# Patient Record
Sex: Male | Born: 1945 | Race: White | Hispanic: No | Marital: Married | Smoking: Never smoker
Health system: Southern US, Community
[De-identification: ages and names within clinical notes are randomized; demographics above are authoritative.]

## PROBLEM LIST (undated history)

## (undated) DIAGNOSIS — F419 Anxiety disorder, unspecified: Secondary | ICD-10-CM

## (undated) DIAGNOSIS — M1990 Unspecified osteoarthritis, unspecified site: Secondary | ICD-10-CM

## (undated) DIAGNOSIS — I10 Essential (primary) hypertension: Secondary | ICD-10-CM

## (undated) DIAGNOSIS — Z87442 Personal history of urinary calculi: Secondary | ICD-10-CM

## (undated) HISTORY — PX: HERNIA REPAIR: SHX51

## (undated) HISTORY — PX: URETERAL STENT PLACEMENT: SHX822

## (undated) HISTORY — PX: BACK SURGERY: SHX140

---

## 2000-02-25 ENCOUNTER — Emergency Department (HOSPITAL_COMMUNITY): Admission: EM | Admit: 2000-02-25 | Discharge: 2000-02-25 | Payer: Self-pay

## 2001-02-23 ENCOUNTER — Ambulatory Visit (HOSPITAL_COMMUNITY): Admission: RE | Admit: 2001-02-23 | Discharge: 2001-02-23 | Payer: Self-pay | Admitting: Gastroenterology

## 2001-02-23 ENCOUNTER — Encounter (INDEPENDENT_AMBULATORY_CARE_PROVIDER_SITE_OTHER): Payer: Self-pay | Admitting: Specialist

## 2005-07-29 ENCOUNTER — Inpatient Hospital Stay (HOSPITAL_COMMUNITY): Admission: EM | Admit: 2005-07-29 | Discharge: 2005-08-01 | Payer: Self-pay | Admitting: *Deleted

## 2008-02-16 ENCOUNTER — Emergency Department (HOSPITAL_COMMUNITY): Admission: EM | Admit: 2008-02-16 | Discharge: 2008-02-16 | Payer: Self-pay | Admitting: Family Medicine

## 2010-10-17 NOTE — Discharge Summary (Signed)
Javier Ewing, Javier Ewing NO.:  [id number]   MEDICAL RECORD NO.:  [id number]          PATIENT TYPE:  INP   LOCATION:  2007                         FACILITY:  [facility]   PHYSICIAN:  Barry Dienes. Eloise Harman, M.D.DATE OF BIRTH:  [birth year]   DATE OF ADMISSION:  07/29/2005  DATE OF DISCHARGE:  08/01/2005                                 DISCHARGE SUMMARY   PERTINENT FINDINGS:  The patient is a 65 year old white male with a history  of hypertension, osteoarthritis, and hyperlipidemia who presented to the  emergency room with nausea, vomiting, diarrhea, and hypotension. On the day  prior to admission, he awoke at 2 a.m. with profound nausea, vomiting and  profuse diarrhea. He denied preceding fever, chest pain, or shortness of  breath. He had some minimal abdominal cramping and denied bloody stools. He  also denied recent antibiotic treatment. He was treated with antiemetics and  IV fluids in the emergency room but continued to vomit and remained  hypotensive, so he was admitted for further evaluation.   MEDICATIONS PRIOR TO ADMISSION:  1.  Avalide 300/25 1 tablet p.o. daily.  2.  Aspirin 81 milligrams daily.  3.  Multivitamin 1 tablet daily.   PHYSICAL EXAM:  Temperature 102, blood pressure 90/70, pulse 80, respiratory  rate 18. In general, he was ill-appearing white male who was in no apparent  distress. HEENT exam was within normal limits. Neck was supple without  jugular venous distension or carotid bruit or stiff neck. Chest was clear.  Heart had a regular rate and rhythm without significant murmur or gallop.  Abdomen had normal bowel sounds with no hepatosplenomegaly or tenderness.  Back was without CVA tenderness. Extremities were without cyanosis,  clubbing, or edema.   INITIAL LABORATORY STUDIES:  Serum sodium 137, potassium 4.3, chloride 103,  carbon dioxide 26, BUN 30, creatinine 1.9, glucose 163, calcium 10.3, total  protein 8.6, albumin 4.7. White blood cell 16.2,  hemoglobin 17.7, hematocrit  49, platelets 252,000, 93% neutrophils.   HOSPITAL COURSE:  The patient was admitted to a medical bed without  telemetry. He had extensive IV fluids. He was treated with antiemetics. He  had a CT scan of the abdomen and pelvis with oral contrast alone that showed  the following: 1) Nonobstructing left renal calculus; 2) Diffuse fatty  replacement of the pancreas; 3) Accessory splenule; 4) Normal appendix; 5)  Diffuse small and large bowel air-fluid levels which can be seen with  gastroenteritis; 6) Lumbosacral spine osteoarthritis changes with L5 pars  defects.   COMPLICATIONS:  None.   CONDITION ON DISCHARGE:  He has been eating well over the past day with no  nausea, vomiting, or diarrhea. His current vital signs include blood  pressure 121/80, pulse 67, respirations 20, temperature 97.3, pulse oxygen  saturation 95% on room air. His chest was clear to auscultation. Heart has a  regular rate and rhythm. His abdomen has normal bowel sounds and no  tenderness. His extremities are without edema.   MOST RECENT LABORATORY TESTS:  White blood cell count 8.4, hemoglobin 12,  hematocrit 33, platelets 171,000. Serum  sodium 140, potassium 3.4, chloride  112, carbon dioxide 26, BUN 13, creatinine 0.8, glucose 90. Fecal leukocytes  negative. Stool C. Difficile toxin test negative and blood cultures were  negative to date.   DISCHARGE DIAGNOSES:  1.  Severe viral gastroenteritis.  2.  Dehydration.  3.  Anemia.  4.  Hypokalemia.  5.  History of hypertension.  6.  Osteoarthritis of the lumbar spine.  7.  Spondylolisthesis.   DISCHARGE MEDICATIONS:  1.  He was advised to restart his Avalide 300/25 in two days following      discharge.  2.  Aspirin 81 milligrams daily.  3.  Multivitamin 1 tablet daily.  4.  He was advised that he could use of Imodium AD 1-2 tablets p.o. daily      p.r.n. diarrhea if he has no fever (temperature less than 99.5 degrees       Fahrenheit).   FOLLOW-UP PLANS:  He should see Dr. Jacky Kindle at [facility]  in approximately two weeks following discharge and to call [phone] for that  appointment. He was also advised to call our office or the on-call physician  if he develops severe diarrhea or diarrhea occurs with fever.           ______________________________  Barry Dienes Eloise Harman, M.D.     DGP/MEDQ  D:  08/01/2005  T:  08/03/2005  Job:  [id number]

## 2010-10-17 NOTE — H&P (Signed)
Javier, Ewing NO.:  [id number]   MEDICAL RECORD NO.:  [id number]          PATIENT TYPE:  INP   LOCATION:  1823                         FACILITY:  [facility]   PHYSICIAN:  Geoffry Paradise, M.D.  DATE OF BIRTH:  Sep 14, 1945   DATE OF ADMISSION:  07/29/2005  DATE OF DISCHARGE:                                HISTORY & PHYSICAL   CHIEF COMPLAINT:  Nausea, vomiting, diarrhea.   HISTORY OF PRESENT ILLNESS:  Javier Ewing is a 65 year old gentleman well-known to  myself with hypertension, osteoarthritis, and hyperlipidemia, presenting at  this time with nausea, vomiting, diarrhea, and hypotension. He has no prior  cardiac history and has been in excellent health. His last annual physical  exam was in November and all was stable. He had a normal day yesterday but  awoke at 2 a.m. with profound nausea, vomiting, and profuse diarrhea. He  became weak and wobbly and then subsequently after summoning his wife, who  was out of town, presented to the emergency room for further management. He  denies any preceding fever, chest pain, or shortness of breath. He does  relate some minimal abdominal cramping but no frank pain and no back pain.  He has had no rash, headaches, and no bloody stool or emesis. In the  emergency room his emesis was bilious and his stool was voluminous, and  while he did have bright red blood on one occasion, subsequently it has been  dark green stool. He has been treated aggressively with antiemetics and IV  fluids but remains with a systolic blood pressure in the 90-100 range,  febrile, and continues to vomit. He is admitted for further management.   PAST MEDICAL HISTORY:  No known drug allergies.   Medications include:  1.  Avalide 300/25 one daily.  2.  Aspirin 81 mg daily.  3.  Multivitamin one daily.   Medical illnesses include essential hypertension, osteoarthritis, and  hyperlipidemia. Surgical illnesses:  None. Colonoscopy September 2002.   FAMILY  HISTORY:  Mother with arthritis. Father with colon cancer. Sister:  No illnesses.   SOCIAL HISTORY:  The patient is married, no children of his own. Rare  caffeine, does not smoke or drink. 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   EXAMINATION:  VITAL SIGNS:  Temperature currently is 98.6, T-max was 102.  Systolic blood pressure low 90s, at one point it was logged at 70. Heart  rate is 80, regular. Respiratory rate is 18, unlabored.  GENERAL:  The patient is ill-appearing, supine on the stretcher. He is a bit  pale. Skin is warm to touch, turgor is poor.  HEENT:  Anicteric, visual fields intact, oropharynx benign, dry mucosa. No  blood.  NECK:  Supple, no JVD or bruits. No meningismus.  CHEST:  Clear bilaterally.  CARDIOVASCULAR:  Regular rate and rhythm. No murmur, gallop, rub, or heave.  ABDOMEN:  Soft, good bowel sounds, nontender, no hepatosplenomegaly.  BACK:  Without CVA or spinal tenderness.  EXTREMITIES:  No cyanosis, clubbing, or edema. No rashes. Joints normal.  Warm distally, no mottling.   ASSESSMENT:  1.  Probable  viral gastroenteritis with profound volume depletion.  2.  Essential hypertension.   PLAN:  The patient will be aggressively hydrated, pan cultured. Empiric CT  of abdomen to rule out pancreatitis and/or diverticulitis, albeit doubted.  Further pending his course.           ______________________________  Geoffry Paradise, M.D.     RA/MEDQ  D:  07/29/2005  T:  07/29/2005  Job:  [id number]

## 2011-08-27 ENCOUNTER — Emergency Department (HOSPITAL_COMMUNITY): Payer: Medicare Other

## 2011-08-27 ENCOUNTER — Encounter (HOSPITAL_COMMUNITY): Payer: Self-pay | Admitting: Emergency Medicine

## 2011-08-27 ENCOUNTER — Other Ambulatory Visit: Payer: Self-pay

## 2011-08-27 ENCOUNTER — Emergency Department (HOSPITAL_COMMUNITY)
Admission: EM | Admit: 2011-08-27 | Discharge: 2011-08-27 | Disposition: A | Discharge status: Home / Self Care | Payer: Medicare Other | Attending: Emergency Medicine | Admitting: Emergency Medicine

## 2011-08-27 DIAGNOSIS — R55 Syncope and collapse: Secondary | ICD-10-CM

## 2011-08-27 DIAGNOSIS — S0101XA Laceration without foreign body of scalp, initial encounter: Secondary | ICD-10-CM

## 2011-08-27 DIAGNOSIS — I10 Essential (primary) hypertension: Secondary | ICD-10-CM | POA: Insufficient documentation

## 2011-08-27 DIAGNOSIS — R404 Transient alteration of awareness: Secondary | ICD-10-CM | POA: Insufficient documentation

## 2011-08-27 DIAGNOSIS — E860 Dehydration: Secondary | ICD-10-CM | POA: Insufficient documentation

## 2011-08-27 DIAGNOSIS — M47812 Spondylosis without myelopathy or radiculopathy, cervical region: Secondary | ICD-10-CM | POA: Insufficient documentation

## 2011-08-27 DIAGNOSIS — S0100XA Unspecified open wound of scalp, initial encounter: Secondary | ICD-10-CM | POA: Insufficient documentation

## 2011-08-27 DIAGNOSIS — W1809XA Striking against other object with subsequent fall, initial encounter: Secondary | ICD-10-CM | POA: Insufficient documentation

## 2011-08-27 DIAGNOSIS — R112 Nausea with vomiting, unspecified: Secondary | ICD-10-CM | POA: Insufficient documentation

## 2011-08-27 HISTORY — DX: Essential (primary) hypertension: I10

## 2011-08-27 LAB — BASIC METABOLIC PANEL
BUN: 31 mg/dL — ABNORMAL HIGH (ref 6–23)
Calcium: 10.1 mg/dL (ref 8.4–10.5)
Chloride: 101 mEq/L (ref 96–112)
Creatinine, Ser: 0.9 mg/dL (ref 0.50–1.35)
GFR calc Af Amer: >90 mL/min (ref >90)
Glucose, Bld: 103 mg/dL — ABNORMAL HIGH (ref 70–99)
Sodium: 137 mEq/L (ref 135–145)

## 2011-08-27 LAB — CBC
HCT: 43.2 % (ref 39.0–52.0)
Hemoglobin: 15.4 g/dL (ref 13.0–17.0)
MCV: 94.1 fL (ref 78.0–100.0)
Platelets: 222 10*3/uL (ref 150–400)

## 2011-08-27 LAB — URINE MICROSCOPIC-ADD ON

## 2011-08-27 LAB — DIFFERENTIAL
Basophils Absolute: 0 10*3/uL (ref 0.0–0.1)
Eosinophils Absolute: 0 10*3/uL (ref 0.0–0.7)
Lymphocytes Relative: 5 % — ABNORMAL LOW (ref 12–46)
Monocytes Absolute: 0.7 10*3/uL (ref 0.1–1.0)
Monocytes Relative: 5 % (ref 3–12)

## 2011-08-27 LAB — URINALYSIS, ROUTINE W REFLEX MICROSCOPIC
Bilirubin Urine: NEGATIVE
Leukocytes, UA: NEGATIVE
Urobilinogen, UA: 0.2 mg/dL (ref 0.0–1.0)

## 2011-08-27 MED ORDER — ONDANSETRON HCL 4 MG/2ML IJ SOLN
4.0000 mg | Freq: Once | INTRAMUSCULAR | Status: AC
  Administered 2011-08-27: 4 mg via INTRAVENOUS
  Filled 2011-08-27: qty 2

## 2011-08-27 MED ORDER — SODIUM CHLORIDE 0.9 % IV BOLUS (SEPSIS)
1000.0000 mL | Freq: Once | INTRAVENOUS | Status: AC
  Administered 2011-08-27: 1000 mL via INTRAVENOUS

## 2011-08-27 MED ORDER — MORPHINE SULFATE 4 MG/ML IJ SOLN
4.0000 mg | INTRAMUSCULAR | Status: AC
  Administered 2011-08-27: 4 mg via INTRAVENOUS
  Filled 2011-08-27: qty 1

## 2011-08-27 MED ORDER — METOCLOPRAMIDE HCL 10 MG PO TABS: 10.0000 mg | ORAL_TABLET | Freq: Four times a day (QID) | ORAL | Status: DC | PRN

## 2011-08-27 MED ORDER — SODIUM CHLORIDE 0.9 % IV SOLN
Freq: Once | INTRAVENOUS | Status: AC
  Administered 2011-08-27: 16:00:00 via INTRAVENOUS

## 2011-08-27 NOTE — ED Notes (Signed)
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  Hit head on concrete floor.  Laceration to back of head which EMS wrapped to control bleeding.  Cervical collar in place.

## 2011-08-27 NOTE — Discharge Instructions (Signed)
He need to have the staples removed in 7 days. Either return to the emergency department, go to urgent care Center, or see your primary care doctor to have the staples removed. Take Imodium AD as needed for diarrhea. Take Tylenol and/or Ibuprofen as needed for pain.  Dehydration, Adult Dehydration is when you lose more fluids from the body than you take in. Vital organs like the kidneys, brain, and heart cannot function without a proper amount of fluids and salt. Any loss of fluids from the body can cause dehydration.  CAUSES   Vomiting.   Diarrhea.   Excessive sweating.   Excessive urine output.   Fever.  SYMPTOMS  Mild dehydration  Thirst.   Dry lips.   Slightly dry mouth.  Moderate dehydration  Very dry mouth.   Sunken eyes.   Skin does not bounce back quickly when lightly pinched and released.   Dark urine and decreased urine production.   Decreased tear production.   Headache.  Severe dehydration  Very dry mouth.   Extreme thirst.   Rapid, weak pulse (more than 100 beats per minute at rest).   Cold hands and feet.   Not able to sweat in spite of heat and temperature.   Rapid breathing.   Blue lips.   Confusion and lethargy.   Difficulty being awakened.   Minimal urine production.   No tears.  DIAGNOSIS  Your caregiver will diagnose dehydration based on your symptoms and your exam. Blood and urine tests will help confirm the diagnosis. The diagnostic evaluation should also identify the cause of dehydration. TREATMENT  Treatment of mild or moderate dehydration can often be done at home by increasing the amount of fluids that you drink. It is best to drink small amounts of fluid more often. Drinking too much at one time can make vomiting worse. Refer to the home care instructions below. Severe dehydration needs to be treated at the hospital where you will probably be given intravenous (IV) fluids that contain water and electrolytes. HOME CARE  INSTRUCTIONS   Ask your caregiver about specific rehydration instructions.   Drink enough fluids to keep your urine clear or pale yellow.   Drink small amounts frequently if you have nausea and vomiting.   Eat as you normally do.   Avoid:   Foods or drinks high in sugar.   Carbonated drinks.   Juice.   Extremely hot or cold fluids.   Drinks with caffeine.   Fatty, greasy foods.   Alcohol.   Tobacco.   Overeating.   Gelatin desserts.   Wash your hands well to avoid spreading bacteria and viruses.   Only take over-the-counter or prescription medicines for pain, discomfort, or fever as directed by your caregiver.   Ask your caregiver if you should continue all prescribed and over-the-counter medicines.   Keep all follow-up appointments with your caregiver.  SEEK MEDICAL CARE IF:  You have abdominal pain and it increases or stays in one area (localizes).   You have a rash, stiff neck, or severe headache.   You are irritable, sleepy, or difficult to awaken.   You are weak, dizzy, or extremely thirsty.  SEEK IMMEDIATE MEDICAL CARE IF:   You are unable to keep fluids down or you get worse despite treatment.   You have frequent episodes of vomiting or diarrhea.   You have blood or green matter (bile) in your vomit.   You have blood in your stool or your stool looks black and tarry.  You have not urinated in 6 to 8 hours, or you have only urinated a small amount of very dark urine.   You have a fever.   You faint.  MAKE SURE YOU:   Understand these instructions.   Will watch your condition.   Will get help right away if you are not doing well or get worse.  Document Released: 05/18/2005 Document Revised: 05/07/2011 Document Reviewed: 01/05/2011 [facility] Patient Information 2012 [place], [place].  Nausea and Vomiting Nausea is a sick feeling that often comes before throwing up (vomiting). Vomiting is a reflex where stomach contents come out of your  mouth. Vomiting can cause severe loss of body fluids (dehydration). Children and elderly adults can become dehydrated quickly, especially if they also have diarrhea. Nausea and vomiting are symptoms of a condition or disease. It is important to find the cause of your symptoms. CAUSES   Direct irritation of the stomach lining. This irritation can result from increased acid production (gastroesophageal reflux disease), infection, food poisoning, taking certain medicines (such as nonsteroidal anti-inflammatory drugs), alcohol use, or tobacco use.   Signals from the brain.These signals could be caused by a headache, heat exposure, an inner ear disturbance, increased pressure in the brain from injury, infection, a tumor, or a concussion, pain, emotional stimulus, or metabolic problems.   An obstruction in the gastrointestinal tract (bowel obstruction).   Illnesses such as diabetes, hepatitis, gallbladder problems, appendicitis, kidney problems, cancer, sepsis, atypical symptoms of a heart attack, or eating disorders.   Medical treatments such as chemotherapy and radiation.   Receiving medicine that makes you sleep (general anesthetic) during surgery.  DIAGNOSIS Your caregiver may ask for tests to be done if the problems do not improve after a few days. Tests may also be done if symptoms are severe or if the reason for the nausea and vomiting is not clear. Tests may include:  Urine tests.   Blood tests.   Stool tests.   Cultures (to look for evidence of infection).   X-rays or other imaging studies.  Test results can help your caregiver make decisions about treatment or the need for additional tests. TREATMENT You need to stay well hydrated. Drink frequently but in small amounts.You may wish to drink water, sports drinks, clear broth, or eat frozen ice pops or gelatin dessert to help stay hydrated.When you eat, eating slowly may help prevent nausea.There are also some antinausea medicines  that may help prevent nausea. HOME CARE INSTRUCTIONS   Take all medicine as directed by your caregiver.   If you do not have an appetite, do not force yourself to eat. However, you must continue to drink fluids.   If you have an appetite, eat a normal diet unless your caregiver tells you differently.   Eat a variety of complex carbohydrates (rice, wheat, potatoes, bread), lean meats, yogurt, fruits, and vegetables.   Avoid high-fat foods because they are more difficult to digest.   Drink enough water and fluids to keep your urine clear or pale yellow.   If you are dehydrated, ask your caregiver for specific rehydration instructions. Signs of dehydration may include:   Severe thirst.   Dry lips and mouth.   Dizziness.   Dark urine.   Decreasing urine frequency and amount.   Confusion.   Rapid breathing or pulse.  SEEK IMMEDIATE MEDICAL CARE IF:   You have blood or brown flecks (like coffee grounds) in your vomit.   You have black or bloody stools.   You have  a severe headache or stiff neck.   You are confused.   You have severe abdominal pain.   You have chest pain or trouble breathing.   You do not urinate at least once every 8 hours.   You develop cold or clammy skin.   You continue to vomit for longer than 24 to 48 hours.   You have a fever.  MAKE SURE YOU:   Understand these instructions.   Will watch your condition.   Will get help right away if you are not doing well or get worse.  Document Released: 05/18/2005 Document Revised: 05/07/2011 Document Reviewed: 10/15/2010 [facility] Patient Information 2012 [place], [place].  Syncope You have had a fainting (syncopal) spell. A fainting episode is a sudden, short-lived loss of consciousness. It results in complete recovery. It occurs because there has been a temporary shortage of oxygen and/or sugar (glucose) to the brain. CAUSES   Blood pressure pills and other medications that may lower blood pressure  below normal. Sudden changes in posture (sudden standing).   Over-medication. Take your medications as directed.   Standing too long. This can cause blood to pool in the legs.   Seizure disorders.   Low blood sugar (hypoglycemia) of diabetes. This more commonly causes coma.   Bearing down to go to the bathroom. This can cause your blood pressure to rise suddenly. Your body compensates by making the blood pressure too low when you stop bearing down.   Hardening of the arteries where the brain temporarily does not receive enough blood.   Irregular heart beat and circulatory problems.   Fear, emotional distress, injury, sight of blood, or illness.  Your caregiver will send you home if the syncope was from non-worrisome causes (benign). Depending on your age and health, you may stay to be monitored and observed. If you return home, have someone stay with you if your caregiver feels that is desirable. It is very important to keep all follow-up referrals and appointments in order to properly manage this condition. This is a serious problem which can lead to serious illness and death if not carefully managed.  WARNING: Do not drive or operate machinery until your caregiver feels that it is safe for you to do so. SEEK IMMEDIATE MEDICAL CARE IF:   You have another fainting episode or faint while lying or sitting down. DO NOT DRIVE YOURSELF. Call 911 if no other help is available.   You have chest pain, are feeling sick to your stomach (nausea), vomiting or abdominal pain.   You have an irregular heartbeat or one that is very fast (pulse over 120 beats per minute).   You have a loss of feeling in some part of your body or lose movement in your arms or legs.   You have difficulty with speech, confusion, severe weakness, or visual problems.   You become sweaty and/or feel light headed.  Make sure you are rechecked as instructed. Document Released: 05/18/2005 Document Revised: 05/07/2011  Document Reviewed: 01/06/2007 [facility] Patient Information 2012 [place], [place].  Laceration Care, Adult A laceration is a cut or lesion that goes through all layers of the skin and into the tissue just beneath the skin. TREATMENT  Some lacerations may not require closure. Some lacerations may not be able to be closed due to an increased risk of infection. It is important to see your caregiver as soon as possible after an injury to minimize the risk of infection and maximize the opportunity for successful closure. If closure is  appropriate, pain medicines may be given, if needed. The wound will be cleaned to help prevent infection. Your caregiver will use stitches (sutures), staples, wound glue (adhesive), or skin adhesive strips to repair the laceration. These tools bring the skin edges together to allow for faster healing and a better cosmetic outcome. However, all wounds will heal with a scar. Once the wound has healed, scarring can be minimized by covering the wound with sunscreen during the day for 1 full year. HOME CARE INSTRUCTIONS  For sutures or staples:  Keep the wound clean and dry.   If you were given a bandage (dressing), you should change it at least once a day. Also, change the dressing if it becomes wet or dirty, or as directed by your caregiver.   Wash the wound with soap and water 2 times a day. Rinse the wound off with water to remove all soap. Pat the wound dry with a clean towel.   After cleaning, apply a thin layer of the antibiotic ointment as recommended by your caregiver. This will help prevent infection and keep the dressing from sticking.   You may shower as usual after the first 24 hours. Do not soak the wound in water until the sutures are removed.   Only take over-the-counter or prescription medicines for pain, discomfort, or fever as directed by your caregiver.   Get your sutures or staples removed as directed by your caregiver.  For skin adhesive  strips:  Keep the wound clean and dry.   Do not get the skin adhesive strips wet. You may bathe carefully, using caution to keep the wound dry.   If the wound gets wet, pat it dry with a clean towel.   Skin adhesive strips will fall off on their own. You may trim the strips as the wound heals. Do not remove skin adhesive strips that are still stuck to the wound. They will fall off in time.  For wound adhesive:  You may briefly wet your wound in the shower or bath. Do not soak or scrub the wound. Do not swim. Avoid periods of heavy perspiration until the skin adhesive has fallen off on its own. After showering or bathing, gently pat the wound dry with a clean towel.   Do not apply liquid medicine, cream medicine, or ointment medicine to your wound while the skin adhesive is in place. This may loosen the film before your wound is healed.   If a dressing is placed over the wound, be careful not to apply tape directly over the skin adhesive. This may cause the adhesive to be pulled off before the wound is healed.   Avoid prolonged exposure to sunlight or tanning lamps while the skin adhesive is in place. Exposure to ultraviolet light in the first year will darken the scar.   The skin adhesive will usually remain in place for 5 to 10 days, then naturally fall off the skin. Do not pick at the adhesive film.  You may need a tetanus shot if:  You cannot remember when you had your last tetanus shot.   You have never had a tetanus shot.  If you get a tetanus shot, your arm may swell, get red, and feel warm to the touch. This is common and not a problem. If you need a tetanus shot and you choose not to have one, there is a rare chance of getting tetanus. Sickness from tetanus can be serious. SEEK MEDICAL CARE IF:   You have redness,  swelling, or increasing pain in the wound.   You see a red line that goes away from the wound.   You have yellowish-white fluid (pus) coming from the wound.   You  have a fever.   You notice a bad smell coming from the wound or dressing.   Your wound breaks open before or after sutures have been removed.   You notice something coming out of the wound such as wood or glass.   Your wound is on your hand or foot and you cannot move a finger or toe.  SEEK IMMEDIATE MEDICAL CARE IF:   Your pain is not controlled with prescribed medicine.   You have severe swelling around the wound causing pain and numbness or a change in color in your arm, hand, leg, or foot.   Your wound splits open and starts bleeding.   You have worsening numbness, weakness, or loss of function of any joint around or beyond the wound.   You develop painful lumps near the wound or on the skin anywhere on your body.  MAKE SURE YOU:   Understand these instructions.   Will watch your condition.   Will get help right away if you are not doing well or get worse.  Document Released: 05/18/2005 Document Revised: 05/07/2011 Document Reviewed: 11/11/2010 [facility] Patient Information 2012 [place], [place].

## 2011-08-27 NOTE — ED Notes (Signed)
ZOX:[id number]<AV> Expected date:08/27/11<BR> Expected time: 2:49 PM<BR> Means of arrival:Ambulance<BR> Comments:<BR> M11. 66 yo m. Syncope with fall, hit back of head, hem controlled. 10 mins

## 2011-08-27 NOTE — ED Provider Notes (Signed)
History     CSN: [id number]  Arrival date & time 08/27/11  1514   First MD Initiated Contact with Patient 08/27/11 1524      Chief Complaint  Patient presents with  . Loss of Consciousness    (Consider location/radiation/quality/duration/timing/severity/associated sxs/prior treatment) Patient is a 66 y.o. male presenting with syncope. The history is provided by the patient and the spouse.  Loss of Consciousness  He relates that he developed nausea and vomiting at about noon today, and felt weak after that. He had several episodes of diarrhea. He had appointments to cut hair at 1:00 and 2:00. While working on the second appointment, he developed nausea again and started to walk away when he collapsed. His wife witnessed the episode and states that his eyes rolled back into his head. He hit the back of his head on concrete suffering a laceration. He denies chest pain, heaviness, tightness, pressure. At the moment, he is not having any more nausea but he still feels like he might have more diarrhea. Last tetanus immunization was in 2011. He denied chest pain, heaviness, tightness, pressure. He did have some diaphoresis. He denies hurting anywhere except for a hit his head and neck pain is mild. He was treated by EMS with cervical spine immobilization. He denies any sick contacts.  Past Medical History  Diagnosis Date  . Hypertension     History reviewed. No pertinent past surgical history.  No family history on file.  History  Substance Use Topics  . Smoking status: Not on file  . Smokeless tobacco: Not on file  . Alcohol Use:       Review of Systems  Cardiovascular: Positive for syncope.  All other systems reviewed and are negative.    Allergies  Review of patient's allergies indicates no known allergies.  Home Medications  No current outpatient prescriptions on file.  Ht 5\' 11"  (1.803 m)  Wt 175 lb (79.379 kg)  BMI 24.41 kg/m2  Physical Exam  Nursing note and  vitals reviewed.  67 year old male who is in a stiff cervical collar and a spinal immobilization device. Vital signs are normal. Oxygen saturation is 99% which is normal. There is a laceration on the occiput. TMs are clear without CSF otorrhea or hemotympanum. No other head injuries seen. PERRLA, EOMI. Oropharynx is clear. Neck is nontender. Back is nontender. Lungs are clear without rales, wheezes, or rhonchi. Heart has regular rate rhythm without murmur. Abdomen is soft, flat, nontender without masses or hepatosplenomegaly. Extremities have full range of motion, no cyanosis or edema. Skin is warm and moist without rash. Neurologic: Mental status is normal, cranial nerves are intact, there no focal motor or sensory deficits.  ED Course  LACERATION REPAIR Date/Time: 08/27/2011 5:59 PM Performed by: Dione Booze Authorized by: Preston Fleeting, Shonique Pelphrey Consent: Verbal consent obtained. Written consent not obtained. Risks and benefits: risks, benefits and alternatives were discussed Consent given by: patient Patient understanding: patient states understanding of the procedure being performed Patient consent: the patient's understanding of the procedure matches consent given Procedure consent: procedure consent matches procedure scheduled Relevant documents: relevant documents present and verified Test results: test results available and properly labeled Site marked: the operative site was marked Imaging studies: imaging studies available Required items: required blood products, implants, devices, and special equipment available Patient identity confirmed: verbally with patient and arm band Time out: Immediately prior to procedure a "time out" was called to verify the correct patient, procedure, equipment, support staff and site/side marked as required. Body  area: head/neck Location details: scalp Laceration length: 2 cm Vascular damage: no Patient sedated: no Preparation: Patient was prepped and draped in  the usual sterile fashion. Amount of cleaning: standard Debridement: none Degree of undermining: none Skin closure: staples Number of sutures: 3 Technique: simple Approximation: close Approximation difficulty: simple Dressing: antibiotic ointment Patient tolerance: Patient tolerated the procedure well with no immediate complications.   (including critical care time)  Results for orders placed during the hospital encounter of 08/27/11  CBC      Component Value Range   WBC 14.1 (*) 4.0 - 10.5 (K/uL)   RBC 4.59  4.22 - 5.81 (MIL/uL)   Hemoglobin 15.4  13.0 - 17.0 (g/dL)   HCT 16.1  09.6 - 04.5 (%)   MCV 94.1  78.0 - 100.0 (fL)   MCH 33.6  26.0 - 34.0 (pg)   MCHC 35.6  30.0 - 36.0 (g/dL)   RDW 40.9  81.1 - 91.4 (%)   Platelets 222  150 - 400 (K/uL)  DIFFERENTIAL      Component Value Range   Neutrophils Relative 90 (*) 43 - 77 (%)   Neutro Abs 12.7 (*) 1.7 - 7.7 (K/uL)   Lymphocytes Relative 5 (*) 12 - 46 (%)   Lymphs Abs 0.7  0.7 - 4.0 (K/uL)   Monocytes Relative 5  3 - 12 (%)   Monocytes Absolute 0.7  0.1 - 1.0 (K/uL)   Eosinophils Relative 0  0 - 5 (%)   Eosinophils Absolute 0.0  0.0 - 0.7 (K/uL)   Basophils Relative 0  0 - 1 (%)   Basophils Absolute 0.0  0.0 - 0.1 (K/uL)  BASIC METABOLIC PANEL      Component Value Range   Sodium 137  135 - 145 (mEq/L)   Potassium 4.1  3.5 - 5.1 (mEq/L)   Chloride 101  96 - 112 (mEq/L)   CO2 27  19 - 32 (mEq/L)   Glucose, Bld 103 (*) 70 - 99 (mg/dL)   BUN 31 (*) 6 - 23 (mg/dL)   Creatinine, Ser 7.82  0.50 - 1.35 (mg/dL)   Calcium 95.6  8.4 - 10.5 (mg/dL)   GFR calc non Af Amer 85 (*) >90 (mL/min)   GFR calc Af Amer >90  >90 (mL/min)  URINALYSIS, ROUTINE W REFLEX MICROSCOPIC      Component Value Range   Color, Urine YELLOW  YELLOW    APPearance CLEAR  CLEAR    Specific Gravity, Urine 1.028  1.005 - 1.030    pH 6.0  5.0 - 8.0    Glucose, UA NEGATIVE  NEGATIVE (mg/dL)   Hgb urine dipstick SMALL (*) NEGATIVE    Bilirubin Urine  NEGATIVE  NEGATIVE    Ketones, ur NEGATIVE  NEGATIVE (mg/dL)   Protein, ur 213 (*) NEGATIVE (mg/dL)   Urobilinogen, UA 0.2  0.0 - 1.0 (mg/dL)   Nitrite NEGATIVE  NEGATIVE    Leukocytes, UA NEGATIVE  NEGATIVE   URINE MICROSCOPIC-ADD ON      Component Value Range   Squamous Epithelial / LPF FEW (*) RARE    RBC / HPF 3-6  <3 (RBC/hpf)   Bacteria, UA FEW (*) RARE    Urine-Other MUCOUS PRESENT     Ct Head Wo Contrast  08/27/2011  *RADIOLOGY REPORT*  Clinical Data:  Loss of consciousness  CT HEAD WITHOUT CONTRAST CT CERVICAL SPINE WITHOUT CONTRAST  Technique:  Multidetector CT imaging of the head and cervical spine was performed following the standard protocol without intravenous  contrast.  Multiplanar CT image reconstructions of the cervical spine were also generated.  Comparison:   None  CT HEAD  Findings: There is diffuse patchy low density throughout the subcortical and periventricular white matter consistent with chronic small vessel ischemic change.  There is prominence of the sulci and ventricles consistent with brain atrophy.  There is no evidence for acute brain infarct, hemorrhage or mass.  The skull appears intact.  The paranasal sinuses and mastoid air cells are clear.  IMPRESSION:  1.  No acute intracranial abnormalities. 2.  Small vessel ischemic change and brain atrophy.  CT CERVICAL SPINE  Findings: Normal alignment of the cervical spine.  The vertebral body heights are preserved.  There is facet degenerative change identified bilaterally.  There is disc space narrowing and ventral endplate spurring is noted at C5-6.  There is no fracture or subluxation.  IMPRESSION:  1.  No acute findings. 2.  Cervical spondylosis noted.  Original Report Authenticated By: Rosealee Albee, M.D.   Ct Cervical Spine Wo Contrast  08/27/2011  *RADIOLOGY REPORT*  Clinical Data:  Loss of consciousness  CT HEAD WITHOUT CONTRAST CT CERVICAL SPINE WITHOUT CONTRAST  Technique:  Multidetector CT imaging of the head  and cervical spine was performed following the standard protocol without intravenous contrast.  Multiplanar CT image reconstructions of the cervical spine were also generated.  Comparison:   None  CT HEAD  Findings: There is diffuse patchy low density throughout the subcortical and periventricular white matter consistent with chronic small vessel ischemic change.  There is prominence of the sulci and ventricles consistent with brain atrophy.  There is no evidence for acute brain infarct, hemorrhage or mass.  The skull appears intact.  The paranasal sinuses and mastoid air cells are clear.  IMPRESSION:  1.  No acute intracranial abnormalities. 2.  Small vessel ischemic change and brain atrophy.  CT CERVICAL SPINE  Findings: Normal alignment of the cervical spine.  The vertebral body heights are preserved.  There is facet degenerative change identified bilaterally.  There is disc space narrowing and ventral endplate spurring is noted at C5-6.  There is no fracture or subluxation.  IMPRESSION:  1.  No acute findings. 2.  Cervical spondylosis noted.  Original Report Authenticated By: Rosealee Albee, M.D.    Laboratory workup suggest significant dehydration. He was given a liter bolus of normal saline and an orthostatic vital signs were checked which did not show any significant change in pulse or blood pressure. He did not have any dizziness when he stood up. At this point, it appears that his syncope was related to dehydration with possibly some vasovagal component. He is sent home with a prescription for Reglan for nausea and is to take Imodium as needed for diarrhea. Staples will need to come out in 7 days.  Date: 08/27/2011  Rate: 70  Rhythm: normal sinus rhythm  QRS Axis: normal  Intervals: normal  ST/T Wave abnormalities: nonspecific T wave changes  Conduction Disutrbances:none  Narrative Interpretation: Left ventricular hypertrophy with nonspecific T wave flattening. When compared with ECG of  07/29/2005, no significant changes are seen.  Old EKG Reviewed: unchanged  Laboratory workup shows significant dehydration. He will be given an IV fluid bolus and reevaluated.  1. Syncope   2. Dehydration   3. Nausea and vomiting   4. Scalp laceration       MDM  Syncope most likely related to vomiting and diarrhea. Vomiting diarrhea are most likely viral. Syncope seems to  be partly from dehydration and partly vasovagal.        Dione Booze, MD 08/27/11 2057

## 2012-01-26 ENCOUNTER — Other Ambulatory Visit: Payer: Self-pay | Admitting: Neurosurgery

## 2012-01-26 DIAGNOSIS — M47812 Spondylosis without myelopathy or radiculopathy, cervical region: Secondary | ICD-10-CM

## 2012-01-26 DIAGNOSIS — M5030 Other cervical disc degeneration, unspecified cervical region: Secondary | ICD-10-CM

## 2012-01-26 DIAGNOSIS — M542 Cervicalgia: Secondary | ICD-10-CM

## 2012-01-28 ENCOUNTER — Ambulatory Visit
Admission: RE | Admit: 2012-01-28 | Discharge: 2012-01-28 | Disposition: A | Discharge status: Home / Self Care | Payer: Medicare Other | Source: Ambulatory Visit | Attending: Neurosurgery | Admitting: Neurosurgery

## 2012-01-28 DIAGNOSIS — M542 Cervicalgia: Secondary | ICD-10-CM

## 2012-01-28 DIAGNOSIS — M47812 Spondylosis without myelopathy or radiculopathy, cervical region: Secondary | ICD-10-CM

## 2012-01-28 DIAGNOSIS — M5030 Other cervical disc degeneration, unspecified cervical region: Secondary | ICD-10-CM

## 2012-02-11 ENCOUNTER — Other Ambulatory Visit: Payer: Self-pay | Admitting: Neurosurgery

## 2012-02-24 ENCOUNTER — Encounter (HOSPITAL_COMMUNITY): Payer: Self-pay | Admitting: Respiratory Therapy

## 2012-02-29 MED ORDER — FERUMOXYTOL INJECTION 510 MG/17 ML
INTRAVENOUS | Status: AC
  Filled 2012-02-29: qty 17

## 2012-03-01 NOTE — Pre-Procedure Instructions (Addendum)
[address] [name]  03/01/2012   Your procedure is scheduled on:  Tuesday March 08, 2012  Report to [facility] at 8:30 AM.  Call this number if you have problems the morning of surgery: [phone]   Remember:   Do not eat food or drink :After Midnight.      Take these medicines the morning of surgery with A SIP OF WATER: none STOP aspirin, multi vit, fish oil now   Do not wear jewelry, make-up or nail polish.  Do not wear lotions, powders, or perfumes.  Do not shave 48 hours prior to surgery. Men may shave face and neck.  Do not bring valuables to the hospital.  Contacts, dentures or bridgework may not be worn into surgery.  Leave suitcase in the car. After surgery it may be brought to your room.  For patients admitted to the hospital, checkout time is 11:00 AM the day of discharge.   Patients discharged the day of surgery will not be allowed to drive home.  Name and phone number of your driver: family / friend Javier Ewing [phone]  Special Instructions: Shower using CHG 2 nights before surgery and the night before surgery.  If you shower the day of surgery use CHG.  Use special wash - you have one bottle of CHG for all showers.  You should use approximately 1/3 of the bottle for each shower.   Please read over the following fact sheets that you were given: Pain Booklet, Coughing and Deep Breathing, MRSA Information and Surgical Site Infection Prevention

## 2012-03-02 ENCOUNTER — Ambulatory Visit (HOSPITAL_COMMUNITY)
Admission: RE | Admit: 2012-03-02 | Discharge: 2012-03-02 | Disposition: A | Discharge status: Home / Self Care | Payer: Medicare Other | Source: Ambulatory Visit | Attending: Neurosurgery | Admitting: Neurosurgery

## 2012-03-02 ENCOUNTER — Encounter (HOSPITAL_COMMUNITY)
Admission: RE | Admit: 2012-03-02 | Discharge: 2012-03-02 | Disposition: A | Discharge status: Home / Self Care | Payer: Medicare Other | Source: Ambulatory Visit | Attending: Neurosurgery | Admitting: Neurosurgery

## 2012-03-02 ENCOUNTER — Encounter (HOSPITAL_COMMUNITY): Payer: Self-pay

## 2012-03-02 DIAGNOSIS — Z01812 Encounter for preprocedural laboratory examination: Secondary | ICD-10-CM | POA: Insufficient documentation

## 2012-03-02 DIAGNOSIS — Z01818 Encounter for other preprocedural examination: Secondary | ICD-10-CM | POA: Insufficient documentation

## 2012-03-02 DIAGNOSIS — I10 Essential (primary) hypertension: Secondary | ICD-10-CM | POA: Insufficient documentation

## 2012-03-02 HISTORY — DX: Unspecified osteoarthritis, unspecified site: M19.90

## 2012-03-02 LAB — BASIC METABOLIC PANEL
BUN: 23 mg/dL (ref 6–23)
Calcium: 10.2 mg/dL (ref 8.4–10.5)
Creatinine, Ser: 0.77 mg/dL (ref 0.50–1.35)
GFR calc Af Amer: >90 mL/min (ref >90)
GFR calc non Af Amer: >90 mL/min (ref >90)

## 2012-03-02 LAB — CBC
HCT: 39.5 % (ref 39.0–52.0)
MCHC: 35.7 g/dL (ref 30.0–36.0)
Platelets: 204 10*3/uL (ref 150–400)
RDW: 13.5 % (ref 11.5–15.5)

## 2012-03-02 LAB — SURGICAL PCR SCREEN: MRSA, PCR: NEGATIVE

## 2012-03-07 MED ORDER — CEFAZOLIN SODIUM-DEXTROSE 2-3 GM-% IV SOLR
2.0000 g | INTRAVENOUS | Status: AC
  Administered 2012-03-08: 2 g via INTRAVENOUS
  Filled 2012-03-07: qty 50

## 2012-03-08 ENCOUNTER — Ambulatory Visit (HOSPITAL_COMMUNITY): Payer: Medicare Other

## 2012-03-08 ENCOUNTER — Observation Stay (HOSPITAL_COMMUNITY)
Admission: RE | Admit: 2012-03-08 | Discharge: 2012-03-09 | Disposition: A | Discharge status: Home / Self Care | Payer: Medicare Other | Source: Ambulatory Visit | Attending: Neurosurgery | Admitting: Neurosurgery

## 2012-03-08 ENCOUNTER — Encounter (HOSPITAL_COMMUNITY): Payer: Self-pay | Admitting: Anesthesiology

## 2012-03-08 ENCOUNTER — Ambulatory Visit (HOSPITAL_COMMUNITY): Payer: Medicare Other | Admitting: Anesthesiology

## 2012-03-08 ENCOUNTER — Encounter (HOSPITAL_COMMUNITY): Payer: Self-pay | Admitting: Surgery

## 2012-03-08 ENCOUNTER — Encounter (HOSPITAL_COMMUNITY)
Admission: RE | Disposition: A | Discharge status: Home / Self Care | Payer: Self-pay | Source: Ambulatory Visit | Attending: Neurosurgery

## 2012-03-08 DIAGNOSIS — M5020 Other cervical disc displacement, unspecified cervical region: Principal | ICD-10-CM | POA: Insufficient documentation

## 2012-03-08 DIAGNOSIS — M47812 Spondylosis without myelopathy or radiculopathy, cervical region: Secondary | ICD-10-CM | POA: Insufficient documentation

## 2012-03-08 DIAGNOSIS — M5030 Other cervical disc degeneration, unspecified cervical region: Secondary | ICD-10-CM | POA: Insufficient documentation

## 2012-03-08 DIAGNOSIS — I10 Essential (primary) hypertension: Secondary | ICD-10-CM | POA: Insufficient documentation

## 2012-03-08 HISTORY — PX: ANTERIOR CERVICAL DECOMP/DISCECTOMY FUSION: SHX1161

## 2012-03-08 SURGERY — ANTERIOR CERVICAL DECOMPRESSION/DISCECTOMY FUSION 2 LEVELS
Anesthesia: General | Site: Neck | Wound class: Clean

## 2012-03-08 MED ORDER — LOSARTAN POTASSIUM-HCTZ 100-25 MG PO TABS: 1.0000 | ORAL_TABLET | Freq: Every day | ORAL | Status: DC

## 2012-03-08 MED ORDER — HYDROCHLOROTHIAZIDE 25 MG PO TABS
25.0000 mg | ORAL_TABLET | Freq: Every day | ORAL | Status: DC
  Filled 2012-03-08: qty 1

## 2012-03-08 MED ORDER — HEMOSTATIC AGENTS (NO CHARGE) OPTIME
TOPICAL | Status: DC | PRN
  Administered 2012-03-08: 1 via TOPICAL

## 2012-03-08 MED ORDER — MEPERIDINE HCL 25 MG/ML IJ SOLN: 6.2500 mg | INTRAMUSCULAR | Status: DC | PRN

## 2012-03-08 MED ORDER — ROCURONIUM BROMIDE 100 MG/10ML IV SOLN
INTRAVENOUS | Status: DC | PRN
  Administered 2012-03-08: 50 mg via INTRAVENOUS

## 2012-03-08 MED ORDER — MIDAZOLAM HCL 5 MG/5ML IJ SOLN
INTRAMUSCULAR | Status: DC | PRN
  Administered 2012-03-08: 2 mg via INTRAVENOUS

## 2012-03-08 MED ORDER — ACETAMINOPHEN 325 MG PO TABS: 650.0000 mg | ORAL_TABLET | ORAL | Status: DC | PRN

## 2012-03-08 MED ORDER — ONDANSETRON HCL 4 MG/2ML IJ SOLN
INTRAMUSCULAR | Status: DC | PRN
  Administered 2012-03-08: 4 mg via INTRAVENOUS

## 2012-03-08 MED ORDER — SODIUM CHLORIDE 0.9 % IV SOLN: 250.0000 mL | INTRAVENOUS | Status: DC

## 2012-03-08 MED ORDER — GLYCOPYRROLATE 0.2 MG/ML IJ SOLN
INTRAMUSCULAR | Status: DC | PRN
  Administered 2012-03-08: 0.6 mg via INTRAVENOUS

## 2012-03-08 MED ORDER — LOSARTAN POTASSIUM 50 MG PO TABS
100.0000 mg | ORAL_TABLET | Freq: Every day | ORAL | Status: DC
  Filled 2012-03-08: qty 2

## 2012-03-08 MED ORDER — DEXAMETHASONE SODIUM PHOSPHATE 4 MG/ML IJ SOLN
INTRAMUSCULAR | Status: DC | PRN
  Administered 2012-03-08: 10 mg via INTRAVENOUS

## 2012-03-08 MED ORDER — 0.9 % SODIUM CHLORIDE (POUR BTL) OPTIME
TOPICAL | Status: DC | PRN
  Administered 2012-03-08: 1000 mL

## 2012-03-08 MED ORDER — LIDOCAINE-EPINEPHRINE 1 %-1:100000 IJ SOLN
INTRAMUSCULAR | Status: DC | PRN
  Administered 2012-03-08: 4.5 mL

## 2012-03-08 MED ORDER — DOCUSATE SODIUM 100 MG PO CAPS
100.0000 mg | ORAL_CAPSULE | Freq: Two times a day (BID) | ORAL | Status: DC
  Administered 2012-03-08: 100 mg via ORAL
  Filled 2012-03-08: qty 1

## 2012-03-08 MED ORDER — OXYCODONE HCL 5 MG PO TABS: 5.0000 mg | ORAL_TABLET | Freq: Once | ORAL | Status: DC | PRN

## 2012-03-08 MED ORDER — PROPOFOL 10 MG/ML IV BOLUS
INTRAVENOUS | Status: DC | PRN
  Administered 2012-03-08: 120 mg via INTRAVENOUS

## 2012-03-08 MED ORDER — OXYCODONE-ACETAMINOPHEN 5-325 MG PO TABS
1.0000 | ORAL_TABLET | ORAL | Status: DC | PRN
  Administered 2012-03-09: 1 via ORAL
  Filled 2012-03-08: qty 1

## 2012-03-08 MED ORDER — CEFAZOLIN SODIUM 1-5 GM-% IV SOLN
1.0000 g | Freq: Three times a day (TID) | INTRAVENOUS | Status: AC
  Administered 2012-03-08 – 2012-03-09 (×2): 1 g via INTRAVENOUS
  Filled 2012-03-08 (×2): qty 50

## 2012-03-08 MED ORDER — MORPHINE SULFATE 2 MG/ML IJ SOLN: 1.0000 mg | INTRAMUSCULAR | Status: DC | PRN

## 2012-03-08 MED ORDER — PHENOL 1.4 % MT LIQD: 1.0000 | OROMUCOSAL | Status: DC | PRN

## 2012-03-08 MED ORDER — LACTATED RINGERS IV SOLN
INTRAVENOUS | Status: DC | PRN
  Administered 2012-03-08 (×2): via INTRAVENOUS

## 2012-03-08 MED ORDER — KCL IN DEXTROSE-NACL 20-5-0.45 MEQ/L-%-% IV SOLN
INTRAVENOUS | Status: DC
  Filled 2012-03-08 (×3): qty 1000

## 2012-03-08 MED ORDER — ZOLPIDEM TARTRATE 5 MG PO TABS: 5.0000 mg | ORAL_TABLET | Freq: Every evening | ORAL | Status: DC | PRN

## 2012-03-08 MED ORDER — MENTHOL 3 MG MT LOZG: 1.0000 | LOZENGE | OROMUCOSAL | Status: DC | PRN

## 2012-03-08 MED ORDER — VECURONIUM BROMIDE 10 MG IV SOLR
INTRAVENOUS | Status: DC | PRN
  Administered 2012-03-08: 2 mg via INTRAVENOUS

## 2012-03-08 MED ORDER — HYDROMORPHONE HCL PF 1 MG/ML IJ SOLN: 0.2500 mg | INTRAMUSCULAR | Status: DC | PRN

## 2012-03-08 MED ORDER — LIDOCAINE HCL (CARDIAC) 20 MG/ML IV SOLN
INTRAVENOUS | Status: DC | PRN
  Administered 2012-03-08: 60 mg via INTRAVENOUS

## 2012-03-08 MED ORDER — ADULT MULTIVITAMIN W/MINERALS CH
1.0000 | ORAL_TABLET | Freq: Every day | ORAL | Status: DC
  Filled 2012-03-08: qty 1

## 2012-03-08 MED ORDER — SODIUM CHLORIDE 0.9 % IJ SOLN: 3.0000 mL | INTRAMUSCULAR | Status: DC | PRN

## 2012-03-08 MED ORDER — ONDANSETRON HCL 4 MG/2ML IJ SOLN: 4.0000 mg | INTRAMUSCULAR | Status: DC | PRN

## 2012-03-08 MED ORDER — NEOSTIGMINE METHYLSULFATE 1 MG/ML IJ SOLN
INTRAMUSCULAR | Status: DC | PRN
  Administered 2012-03-08: 3 mg via INTRAVENOUS

## 2012-03-08 MED ORDER — PANTOPRAZOLE SODIUM 40 MG IV SOLR
40.0000 mg | Freq: Every day | INTRAVENOUS | Status: DC
  Filled 2012-03-08: qty 40

## 2012-03-08 MED ORDER — EPHEDRINE SULFATE 50 MG/ML IJ SOLN
INTRAMUSCULAR | Status: DC | PRN
  Administered 2012-03-08 (×2): 10 mg via INTRAVENOUS
  Administered 2012-03-08: 5 mg via INTRAVENOUS
  Administered 2012-03-08: 10 mg via INTRAVENOUS

## 2012-03-08 MED ORDER — SODIUM CHLORIDE 0.9 % IV SOLN
INTRAVENOUS | Status: AC
  Filled 2012-03-08: qty 500

## 2012-03-08 MED ORDER — ONDANSETRON HCL 4 MG/2ML IJ SOLN: 4.0000 mg | Freq: Once | INTRAMUSCULAR | Status: DC | PRN

## 2012-03-08 MED ORDER — METHOCARBAMOL 100 MG/ML IJ SOLN: 500.0000 mg | Freq: Four times a day (QID) | INTRAVENOUS | Status: DC | PRN

## 2012-03-08 MED ORDER — OXYCODONE HCL 5 MG/5ML PO SOLN: 5.0000 mg | Freq: Once | ORAL | Status: DC | PRN

## 2012-03-08 MED ORDER — DIAZEPAM 5 MG PO TABS: 5.0000 mg | ORAL_TABLET | Freq: Four times a day (QID) | ORAL | Status: DC | PRN

## 2012-03-08 MED ORDER — BACITRACIN 50000 UNITS IM SOLR
INTRAMUSCULAR | Status: AC
  Filled 2012-03-08: qty 1

## 2012-03-08 MED ORDER — FENTANYL CITRATE 0.05 MG/ML IJ SOLN
INTRAMUSCULAR | Status: DC | PRN
  Administered 2012-03-08: 150 ug via INTRAVENOUS
  Administered 2012-03-08: 50 ug via INTRAVENOUS

## 2012-03-08 MED ORDER — PHENYLEPHRINE HCL 10 MG/ML IJ SOLN
INTRAMUSCULAR | Status: DC | PRN
  Administered 2012-03-08: 80 ug via INTRAVENOUS
  Administered 2012-03-08: 120 ug via INTRAVENOUS

## 2012-03-08 MED ORDER — SODIUM CHLORIDE 0.9 % IJ SOLN
3.0000 mL | Freq: Two times a day (BID) | INTRAMUSCULAR | Status: DC
  Administered 2012-03-08: 3 mL via INTRAVENOUS

## 2012-03-08 MED ORDER — VITAMIN D3 25 MCG (1000 UNIT) PO TABS
1000.0000 [IU] | ORAL_TABLET | Freq: Every day | ORAL | Status: DC
  Filled 2012-03-08: qty 1

## 2012-03-08 MED ORDER — THROMBIN 5000 UNITS EX KIT
PACK | CUTANEOUS | Status: DC | PRN
  Administered 2012-03-08 (×2): 5000 [IU] via TOPICAL

## 2012-03-08 MED ORDER — ACETAMINOPHEN 650 MG RE SUPP: 650.0000 mg | RECTAL | Status: DC | PRN

## 2012-03-08 MED ORDER — SODIUM CHLORIDE 0.9 % IR SOLN
Status: DC | PRN
  Administered 2012-03-08: 11:00:00

## 2012-03-08 MED ORDER — HYDROCODONE-ACETAMINOPHEN 5-325 MG PO TABS: 1.0000 | ORAL_TABLET | ORAL | Status: DC | PRN

## 2012-03-08 MED ORDER — BUPIVACAINE HCL (PF) 0.5 % IJ SOLN
INTRAMUSCULAR | Status: DC | PRN
  Administered 2012-03-08: 4.5 mL

## 2012-03-08 MED ORDER — METHOCARBAMOL 500 MG PO TABS: 500.0000 mg | ORAL_TABLET | Freq: Four times a day (QID) | ORAL | Status: DC | PRN

## 2012-03-08 SURGICAL SUPPLY — 73 items
ADH SKN CLS APL DERMABOND .7 (GAUZE/BANDAGES/DRESSINGS) ×1
APL SKNCLS STERI-STRIP NONHPOA (GAUZE/BANDAGES/DRESSINGS)
BAG DECANTER FOR FLEXI CONT (MISCELLANEOUS) ×2 IMPLANT
BANDAGE GAUZE ELAST BULKY 4 IN (GAUZE/BANDAGES/DRESSINGS) IMPLANT
BENZOIN TINCTURE PRP APPL 2/3 (GAUZE/BANDAGES/DRESSINGS) IMPLANT
BIT DRILL 14MM (INSTRUMENTS) ×1 IMPLANT
BIT DRILL NEURO 2X3.1 SFT TUCH (MISCELLANEOUS) ×1 IMPLANT
BLADE ULTRA TIP 2M (BLADE) IMPLANT
BUR BARREL STRAIGHT FLUTE 4.0 (BURR) ×2 IMPLANT
CANISTER SUCTION 2500CC (MISCELLANEOUS) ×2 IMPLANT
CLOTH BEACON ORANGE TIMEOUT ST (SAFETY) ×2 IMPLANT
CONT SPEC 4OZ CLIKSEAL STRL BL (MISCELLANEOUS) ×2 IMPLANT
COVER MAYO STAND STRL (DRAPES) ×2 IMPLANT
DERMABOND ADVANCED (GAUZE/BANDAGES/DRESSINGS) ×1
DERMABOND ADVANCED .7 DNX12 (GAUZE/BANDAGES/DRESSINGS) ×1 IMPLANT
DRAPE LAPAROTOMY 100X72 PEDS (DRAPES) ×2 IMPLANT
DRAPE MICROSCOPE LEICA (MISCELLANEOUS) ×1 IMPLANT
DRAPE POUCH INSTRU U-SHP 10X18 (DRAPES) ×2 IMPLANT
DRAPE PROXIMA HALF (DRAPES) IMPLANT
DRESSING TELFA 8X3 (GAUZE/BANDAGES/DRESSINGS) IMPLANT
DRILL 14MM (INSTRUMENTS) ×2
DRILL NEURO 2X3.1 SOFT TOUCH (MISCELLANEOUS) ×2
DURAPREP 6ML APPLICATOR 50/CS (WOUND CARE) ×2 IMPLANT
ELECT COATED BLADE 2.86 ST (ELECTRODE) ×2 IMPLANT
ELECT REM PT RETURN 9FT ADLT (ELECTROSURGICAL) ×2
ELECTRODE REM PT RTRN 9FT ADLT (ELECTROSURGICAL) ×1 IMPLANT
GAUZE SPONGE 4X4 16PLY XRAY LF (GAUZE/BANDAGES/DRESSINGS) IMPLANT
GLOVE BIO SURGEON STRL SZ8 (GLOVE) ×2 IMPLANT
GLOVE BIOGEL M 8.0 STRL (GLOVE) ×4 IMPLANT
GLOVE BIOGEL PI IND STRL 7.5 (GLOVE) IMPLANT
GLOVE BIOGEL PI IND STRL 8 (GLOVE) ×1 IMPLANT
GLOVE BIOGEL PI IND STRL 8.5 (GLOVE) ×1 IMPLANT
GLOVE BIOGEL PI INDICATOR 7.5 (GLOVE) ×1
GLOVE BIOGEL PI INDICATOR 8 (GLOVE) ×4
GLOVE BIOGEL PI INDICATOR 8.5 (GLOVE) ×1
GLOVE ECLIPSE 7.5 STRL STRAW (GLOVE) ×5 IMPLANT
GLOVE ECLIPSE 8.0 STRL XLNG CF (GLOVE) ×2 IMPLANT
GLOVE EXAM NITRILE LRG STRL (GLOVE) IMPLANT
GLOVE EXAM NITRILE MD LF STRL (GLOVE) IMPLANT
GLOVE EXAM NITRILE XL STR (GLOVE) IMPLANT
GLOVE EXAM NITRILE XS STR PU (GLOVE) IMPLANT
GLOVE SS BIOGEL STRL SZ 7.5 (GLOVE) IMPLANT
GLOVE SUPERSENSE BIOGEL SZ 7.5 (GLOVE) ×3
GOWN BRE IMP SLV AUR LG STRL (GOWN DISPOSABLE) ×2 IMPLANT
GOWN BRE IMP SLV AUR XL STRL (GOWN DISPOSABLE) ×4 IMPLANT
GOWN STRL REIN 2XL LVL4 (GOWN DISPOSABLE) ×3 IMPLANT
HEAD HALTER (SOFTGOODS) ×2 IMPLANT
KIT BASIN OR (CUSTOM PROCEDURE TRAY) ×2 IMPLANT
KIT ROOM TURNOVER OR (KITS) ×2 IMPLANT
NDL HYPO 18GX1.5 BLUNT FILL (NEEDLE) ×1 IMPLANT
NEEDLE HYPO 18GX1.5 BLUNT FILL (NEEDLE) ×2 IMPLANT
NEEDLE HYPO 25X1 1.5 SAFETY (NEEDLE) ×2 IMPLANT
NEEDLE SPNL 22GX3.5 QUINCKE BK (NEEDLE) ×4 IMPLANT
NS IRRIG 1000ML POUR BTL (IV SOLUTION) ×2 IMPLANT
PACK LAMINECTOMY NEURO (CUSTOM PROCEDURE TRAY) ×2 IMPLANT
PAD ARMBOARD 7.5X6 YLW CONV (MISCELLANEOUS) ×2 IMPLANT
PIN DISTRACTION 14MM (PIN) ×6 IMPLANT
PLATE 30MM (Plate) ×1 IMPLANT
RUBBERBAND STERILE (MISCELLANEOUS) ×2 IMPLANT
SCREW 14MM (Screw) ×12 IMPLANT
SPACER ASSEM CERV LORD 7M (Spacer) ×2 IMPLANT
SPONGE GAUZE 4X4 12PLY (GAUZE/BANDAGES/DRESSINGS) IMPLANT
SPONGE INTESTINAL PEANUT (DISPOSABLE) ×2 IMPLANT
SPONGE SURGIFOAM ABS GEL SZ50 (HEMOSTASIS) IMPLANT
STAPLER SKIN PROX WIDE 3.9 (STAPLE) IMPLANT
STRIP CLOSURE SKIN 1/2X4 (GAUZE/BANDAGES/DRESSINGS) IMPLANT
SUT VIC AB 3-0 SH 8-18 (SUTURE) ×2 IMPLANT
SYR 20ML ECCENTRIC (SYRINGE) ×2 IMPLANT
SYR 3ML LL SCALE MARK (SYRINGE) ×2 IMPLANT
TOWEL OR 17X24 6PK STRL BLUE (TOWEL DISPOSABLE) ×2 IMPLANT
TOWEL OR 17X26 10 PK STRL BLUE (TOWEL DISPOSABLE) ×2 IMPLANT
TRAP SPECIMEN MUCOUS 40CC (MISCELLANEOUS) ×1 IMPLANT
WATER STERILE IRR 1000ML POUR (IV SOLUTION) ×2 IMPLANT

## 2012-03-08 NOTE — Brief Op Note (Signed)
03/08/2012  1:15 PM  PATIENT:  Javier Ewing  66 y.o. male  PRE-OPERATIVE DIAGNOSIS:  Cervical four-five, five-six, Cervicalgia, Cervical spondylosis, Cervical degenerative disc disease, cervical disc herniation and cervical radiculopathy  POST-OPERATIVE DIAGNOSIS:  Cervical four-five, five-six, Cervicalgia, Cervical spondylosis, Cervical degenerative disc disease, cervical disc herniation and cervical radiculopathy  PROCEDURE:  Procedure(s) (LRB) with comments: ANTERIOR CERVICAL DECOMPRESSION/DISCECTOMY FUSION 2 LEVELS (N/A) - Cervical four-five Cervical five six Anterior cervical decompression/diskectomy/fusion with autograft, allograft, plate  SURGEON:  Surgeon(s) and Role:    * Darinda [place], MD - Primary  PHYSICIAN ASSISTANT: Botero, MD  ASSISTANTS: Poteat, RN   ANESTHESIA:   general  EBL:  Total I/O In: 1000 [I.V.:1000] Out: -   BLOOD ADMINISTERED:none  DRAINS: none   LOCAL MEDICATIONS USED:  LIDOCAINE   SPECIMEN:  No Specimen  DISPOSITION OF SPECIMEN:  N/A  COUNTS:  YES  TOURNIQUET:  * No tourniquets in log *  DICTATION: Patient is 66 year old male with right arm pain and weakness with HNP, spondylosis, radiculopathy C 45 and C 56  PROCEDURE: Patient was brought to operating room and following the smooth and uncomplicated induction of general endotracheal anesthesia her head was placed on a horseshoe head holder he was placed in 5 pounds of Holter traction and his anterior neck was prepped and draped in usual sterile fashion. An incision was made on the left side of midline after infiltrating the skin and subcutaneous tissues with local lidocaine. The platysmal layer was incised and subplatysmal dissection was performed exposing the anterior border sternocleidomastoid muscle. Using blunt dissection the carotid sheath was kept lateral and trachea and esophagus kept medial exposing the anterior cervical spine. A bent spinal needle was placed it was felt to be the C 45 and  C 56 levels and this was confirmed on intraoperative x-ray. Longus coli muscles were taken down from the anterior cervical spine using electrocautery and key elevator and self-retaining retractor was placed exposing the C 45 and C 56 levels. Large ventral osteophytes were removed from the C 56 level and this bone was cleaned of investing soft tissue, morselized, and later used with bone grafting.  The interspaces were incised and a thorough discectomy was performed. Distraction pins were placed. Initially the C 45 and C 56 levels were operated. Initially, the C 45 level was operated. Uncinate spurs and central spondylitic ridges were drilled down with a high-speed drill. The spinal cord dura and both C 5 nerve roots were widely decompressed. Hemostasis was assured. After trial sizing a 7 mm allograft bone wedge was selected and packed with autograft. This was tamped into position and countersunk appropriately. Attention was the paid to the C5/6 level, where similar decompression was performed.  Uncinate spurs and central spondylitic ridges were drilled down with a high-speed drill. The spinal cord dura and both C6 nerve roots were widely decompressed. Hemostasis was assured. After trial sizing a similarly sized graft was selected and packed with autograft. This was tamped into position and countersunk appropriately.Distraction weight was removed. A 30 mm trestle luxe anterior cervical plate was affixed to the cervical spine with 14 mm variable-angle screws 2 at C4, 2 at C5 and 2 at C6. All screws were well-positioned and locking mechanisms were engaged. A final X ray was obtained which showed well positioned grafts and anterior plate without complicating features. Soft tissues were inspected and found to be in good repair. The wound was irrigated. The platysma layer was closed with 3-0 Vicryl stitches and the   skin was reapproximated with 3-0 Vicryl subcuticular stitches. The wound was dressed with Dermabond. Counts  were correct at the end of the case. Patient was extubated and taken to recovery in stable and satisfactory condition.   PLAN OF CARE: Admit for overnight observation  PATIENT DISPOSITION:  PACU - hemodynamically stable.   Delay start of Pharmacological VTE agent (>24hrs) due to surgical blood loss or risk of bleeding: yes  

## 2012-03-08 NOTE — Anesthesia Postprocedure Evaluation (Signed)
Anesthesia Post Note  Patient: Javier Ewing  Procedure(s) Performed: Procedure(s) (LRB): ANTERIOR CERVICAL DECOMPRESSION/DISCECTOMY FUSION 2 LEVELS (N/A)  Anesthesia type: general  Patient location: PACU  Post pain: Pain level controlled  Post assessment: Patient's Cardiovascular Status Stable  Last Vitals:  Filed Vitals:   03/08/12 1323  BP: 139/83  Pulse: 86  Temp: 36.4 C  Resp: 15    Post vital signs: Reviewed and stable  Level of consciousness: sedated  Complications: No apparent anesthesia complications

## 2012-03-08 NOTE — Interval H&P Note (Signed)
History and Physical Interval Note:  03/08/2012 7:24 AM  Javier Ewing  has presented today for surgery, with the diagnosis of Cervicalgia, Cervical spondylosis, Cervical degenerative disc disease  The various methods of treatment have been discussed with the patient and family. After consideration of risks, benefits and other options for treatment, the patient has consented to  Procedure(s) (LRB) with comments: ANTERIOR CERVICAL DECOMPRESSION/DISCECTOMY FUSION 2 LEVELS (N/A) - C4-5 C5-6 Anterior cervical decompression/diskectomy/fusion as a surgical intervention .  The patient's history has been reviewed, patient examined, no change in status, stable for surgery.  I have reviewed the patient's chart and labs.  Questions were answered to the patient's satisfaction.     [name] D  Date of Initial H&P: 03/08/2012  History reviewed, patient examined, no change in status, stable for surgery.

## 2012-03-08 NOTE — Transfer of Care (Signed)
Immediate Anesthesia Transfer of Care Note  Patient: Javier Ewing  Procedure(s) Performed: Procedure(s) (LRB) with comments: ANTERIOR CERVICAL DECOMPRESSION/DISCECTOMY FUSION 2 LEVELS (N/A) - Cervical four-five Cervical five six Anterior cervical decompression/diskectomy/fusion  Patient Location: PACU  Anesthesia Type: General  Level of Consciousness: sedated  Airway & Oxygen Therapy: Patient Spontanous Breathing and Patient connected to nasal cannula oxygen  Post-op Assessment: Report given to PACU RN, Post -op Vital signs reviewed and stable and Patient moving all extremities X 4  Post vital signs: Reviewed and stable  Complications: No apparent anesthesia complications

## 2012-03-08 NOTE — Progress Notes (Signed)
Awake, alert, conversant.  Full bilateral upper extremity strength deltoids, biceps, HI's.  MAEW.  Doing well.

## 2012-03-08 NOTE — Op Note (Signed)
03/08/2012  1:15 PM  PATIENT:  Javier Ewing  66 y.o. male  PRE-OPERATIVE DIAGNOSIS:  Cervical four-five, five-six, Cervicalgia, Cervical spondylosis, Cervical degenerative disc disease, cervical disc herniation and cervical radiculopathy  POST-OPERATIVE DIAGNOSIS:  Cervical four-five, five-six, Cervicalgia, Cervical spondylosis, Cervical degenerative disc disease, cervical disc herniation and cervical radiculopathy  PROCEDURE:  Procedure(s) (LRB) with comments: ANTERIOR CERVICAL DECOMPRESSION/DISCECTOMY FUSION 2 LEVELS (N/A) - Cervical four-five Cervical five six Anterior cervical decompression/diskectomy/fusion with autograft, allograft, plate  SURGEON:  Surgeon(s) and Role:    * Maeola Harman, MD - Primary  PHYSICIAN ASSISTANT: Jeral Fruit, MD  ASSISTANTS: Poteat, RN   ANESTHESIA:   general  EBL:  Total I/O In: 1000 [I.V.:1000] Out: -   BLOOD ADMINISTERED:none  DRAINS: none   LOCAL MEDICATIONS USED:  LIDOCAINE   SPECIMEN:  No Specimen  DISPOSITION OF SPECIMEN:  N/A  COUNTS:  YES  TOURNIQUET:  * No tourniquets in log *  DICTATION: Patient is 66 year old male with right arm pain and weakness with HNP, spondylosis, radiculopathy C 45 and C 56  PROCEDURE: Patient was brought to operating room and following the smooth and uncomplicated induction of general endotracheal anesthesia her head was placed on a horseshoe head holder he was placed in 5 pounds of Holter traction and his anterior neck was prepped and draped in usual sterile fashion. An incision was made on the left side of midline after infiltrating the skin and subcutaneous tissues with local lidocaine. The platysmal layer was incised and subplatysmal dissection was performed exposing the anterior border sternocleidomastoid muscle. Using blunt dissection the carotid sheath was kept lateral and trachea and esophagus kept medial exposing the anterior cervical spine. A bent spinal needle was placed it was felt to be the C 45 and  C 56 levels and this was confirmed on intraoperative x-ray. Longus coli muscles were taken down from the anterior cervical spine using electrocautery and key elevator and self-retaining retractor was placed exposing the C 45 and C 56 levels. Large ventral osteophytes were removed from the C 56 level and this bone was cleaned of investing soft tissue, morselized, and later used with bone grafting.  The interspaces were incised and a thorough discectomy was performed. Distraction pins were placed. Initially the C 45 and C 56 levels were operated. Initially, the C 45 level was operated. Uncinate spurs and central spondylitic ridges were drilled down with a high-speed drill. The spinal cord dura and both C 5 nerve roots were widely decompressed. Hemostasis was assured. After trial sizing a 7 mm allograft bone wedge was selected and packed with autograft. This was tamped into position and countersunk appropriately. Attention was the paid to the C5/6 level, where similar decompression was performed.  Uncinate spurs and central spondylitic ridges were drilled down with a high-speed drill. The spinal cord dura and both C6 nerve roots were widely decompressed. Hemostasis was assured. After trial sizing a similarly sized graft was selected and packed with autograft. This was tamped into position and countersunk appropriately.Distraction weight was removed. A 30 mm trestle luxe anterior cervical plate was affixed to the cervical spine with 14 mm variable-angle screws 2 at C4, 2 at C5 and 2 at C6. All screws were well-positioned and locking mechanisms were engaged. A final X ray was obtained which showed well positioned grafts and anterior plate without complicating features. Soft tissues were inspected and found to be in good repair. The wound was irrigated. The platysma layer was closed with 3-0 Vicryl stitches and the  skin was reapproximated with 3-0 Vicryl subcuticular stitches. The wound was dressed with Dermabond. Counts  were correct at the end of the case. Patient was extubated and taken to recovery in stable and satisfactory condition.   PLAN OF CARE: Admit for overnight observation  PATIENT DISPOSITION:  PACU - hemodynamically stable.   Delay start of Pharmacological VTE agent (>24hrs) due to surgical blood loss or risk of bleeding: yes

## 2012-03-08 NOTE — Anesthesia Preprocedure Evaluation (Addendum)
Anesthesia Evaluation  Patient identified by MRN, date of birth, ID band Patient awake    Reviewed: Allergy & Precautions, H&P , NPO status , Patient's Chart, lab work & pertinent test results  Airway Mallampati: I TM Distance: >3 FB Neck ROM: Full    Dental  (+) Teeth Intact   Pulmonary          Cardiovascular hypertension, Pt. on medications     Neuro/Psych    GI/Hepatic   Endo/Other    Renal/GU      Musculoskeletal   Abdominal   Peds  Hematology   Anesthesia Other Findings   Reproductive/Obstetrics                          Anesthesia Physical Anesthesia Plan  ASA: II  Anesthesia Plan: General   Post-op Pain Management:    Induction: Intravenous  Airway Management Planned: Oral ETT  Additional Equipment:   Intra-op Plan:   Post-operative Plan: Extubation in OR  Informed Consent: I have reviewed the patients History and Physical, chart, labs and discussed the procedure including the risks, benefits and alternatives for the proposed anesthesia with the patient or authorized representative who has indicated his/her understanding and acceptance.     Plan Discussed with: CRNA and Surgeon  Anesthesia Plan Comments:         Anesthesia Quick Evaluation  

## 2012-03-08 NOTE — Preoperative (Signed)
Beta Blockers   Reason not to administer Beta Blockers:Not Applicable 

## 2012-03-08 NOTE — H&P (Signed)
Javier Ewing  #[id number]  DOB:  23-Jul-1945  02/10/2012:  Javier Ewing comes in today.  Javier Ewing is frustrated with [place] because we had some delay getting him back to review his MRI.  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    Javier Ewing continues to have infraspinatus atrophy and Javier Ewing continues to have deltoid weakness and biceps weakness on the right.    I have recommended to him that Javier Ewing undergo anterior cervical decompression and fusion surgery at the C4-5 and C5-6 levels.  Javier Ewing wishes to do so.  We discussed the procedure in detail and answered his and his wife's questions.  Risks and benefits were discussed.  We went over models and literature and fitted him for a collar.  Javier Ewing is planning on surgery on 03/08/2012.          Danae Orleans. 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  Javier Ewing says this has been going on for the last ten to 15 years.  Javier Ewing says his numbness has worsened more recently. Javier Ewing went to [facility] and had physical therapy for his shoulder and his neck including traction. Javier Ewing said it helped him some.   Javier Ewing has a history of hypertension.  Javier Ewing has been taking Motrin and Tylenol on an as needed basis.   REVIEW OF SYSTEMS:   A detailed Review of Systems sheet was reviewed with the patient.  Pertinent positives include under eyes - Javier Ewing wears glasses, under cardiovascular - Javier Ewing notes high blood pressure, under musculoskeletal - Javier Ewing notes arm weakness, arm pain, and neck pain; otherwise unremarkable.  All other systems are negative; this includes Constitutional symptoms, Ears, nose, mouth, throat, Endocrine, Respiratory, Gastrointestinal, Genitourinary, Integumentary & Breast, Neurologic, Psychiatric,  Hematologic/Lymphatic, Allergic/Immunologic.    PAST MEDICAL HISTORY:      Current Medical Conditions:    Additional medical problems include hypertension, osteoarthritis, hyperlipidemia, anemia, hyperkalemia, spondylolisthesis, dehydration, and severe viral gastroenteritis.      Prior Operations and Hospitalizations:   Javier Ewing has had no prior surgery.      Medications and Allergies:  Current medications - Losartan/Hydrochlorothiazide 100/25 mg. qd, Fish Oil qd, multivitamin qd, Aspirin 81 mg. qd, and Vitamin D3 1000 mg. qd.      Height and Weight:     Javier Ewing is 5'10" tall and 170 lbs.    FAMILY HISTORY:    His mother died at age 68 with arthritis.  His father died at age 89 with cancer.    SOCIAL HISTORY:    Javier Ewing denies alcohol, drug, or tobacco usage.    DIAGNOSTIC STUDIES:   Javier Ewing says on 08/27/2011 Javier Ewing passed out at work and Javier Ewing was not sure if this was food poisoning and Javier Ewing had a CT scan for vertigo.  Javier Ewing had plain radiographs of the cervical spine in the office today which show that Javier Ewing has spondylosis at C5-6 and right greater than left-sided stenosis, with mild right C4-5 foraminal stenosis.    PHYSICAL EXAMINATION:      General Appearance:   On examination today, Javier Ewing is a pleasant and cooperative man in no acute distress.      Blood Pressure, Pulse:  His blood pressure is 124/78.  Heart rate is 74 and regular.  Respiratory rate is 16.      HEENT - normocephalic, atraumatic.  The pupils are equal, round and reactive to light.  The extraocular muscles are intact.  Sclerae - white.  Conjunctiva - pink.  Oropharynx benign.  Uvula midline.     Neck - there are no masses, meningismus, deformities, tracheal deviation, jugular vein distention or carotid bruits.  There is normal cervical range of motion.  Javier Ewing has a positive Spurlings' maneuver to the right.  Javier Ewing has right infraspinatus muscular atrophy. Negative Spurlings' maneuver to the left.  Negative Lhermitte's sign with axial compression.        Respiratory - there is normal respiratory effort with good intercostal function.  Lungs are clear to auscultation.  There are no rales, rhonchi or wheezes.      Cardiovascular - the heart has regular rate and rhythm to auscultation.  No murmurs are appreciated.  There is no extremity edema, cyanosis or clubbing.  There are palpable pedal pulses.      Abdomen - soft, nontender, no hepatosplenomegaly appreciated or masses.  There are active bowel sounds.  No guarding or rebound.      Musculoskeletal Examination - the patient is able to walk about the examining room with a normal, casual, heel and toe gait.  Javier Ewing also has right infraspinatus atrophy.  Javier Ewing has negative shoulder impingement testing.  Javier Ewing has negative Tinel's and Phalen's signs at the wrists.  NEUROLOGICAL EXAMINATION: The patient is oriented to time, person and place and has good recall of both recent and remote memory with normal attention span and concentration.      The patient speaks with clear and fluent speech and exhibits normal language function and appropriate fund of knowledge.      Cranial Nerve Examination - pupils are equal, round and reactive to light.  Extraocular movements are full.  Visual fields are full to confrontational testing.  Facial sensation and facial movement are symmetric and intact.  Hearing is intact to finger rub.  Palate is upgoing.  Shoulder shrug is symmetric.  Tongue protrudes in the midline.      Motor Examination - motor strength is 5/5 in the bilateral deltoids, triceps, handgrips, wrist extensors, interosseous, right biceps 4/5, and left biceps 5/5.  In the lower extremities motor strength is 5/5 in hip flexion, extension, quadriceps, hamstrings, plantar flexion, dorsiflexion and extensor hallucis longus.      Sensory Examination - normal to light touch and pinprick sensation in the upper and lower extremities.     Deep Tendon Reflexes - 1 in the right biceps, 2 in the left biceps, 2 in the knees, 2  in the ankles.  The great toes are downgoing to plantar stimulation.       Cerebellar Examination - normal coordination in upper and lower extremities and normal rapid alternating movements.  Romberg test is negative.    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  Javier Ewing has not had an MRI of his cervical spine. I have recommended one be done. I will make further recommendations after that has been done.    [facility]    Danae Orleans. Venetia Maxon, M.D.

## 2012-03-09 MED ORDER — INFLUENZA VIRUS VACC SPLIT PF IM SUSP
0.5000 mL | Freq: Once | INTRAMUSCULAR | Status: DC
  Filled 2012-03-09: qty 0.5

## 2012-03-09 MED ORDER — PANTOPRAZOLE SODIUM 40 MG PO TBEC: 40.0000 mg | DELAYED_RELEASE_TABLET | Freq: Every day | ORAL | Status: DC

## 2012-03-09 NOTE — Evaluation (Signed)
Physical Therapy Evaluation Patient Details Name: Javier Ewing MRN: [id number] DOB: [birth year] Today's Date: 03/09/2012 Time: 7253-6644 PT Time Calculation (min): 10 min  PT Assessment / Plan / Recommendation Clinical Impression  Pt is a 66 y/o male admitted s/p C4-6 decompression/disecctomy. Pt is modified independent for most mobility only requiring supervision with verbal cues for safety during ambulation with obstacle avoidance. Pt ready for safe d/c home without PT follow-up. PT signing off. Thanks.    PT Assessment  Patent does not need any further PT services    Follow Up Recommendations  No PT follow up    Does the patient have the potential to tolerate intense rehabilitation      Barriers to Discharge        Equipment Recommendations  None recommended by PT    Recommendations for Other Services     Frequency      Precautions / Restrictions Precautions Precautions: Cervical Precaution Comments: Educated on 3/3 cervical precautions. Required Braces or Orthoses: Cervical Brace Cervical Brace: Soft collar;Applied in sitting position Restrictions Weight Bearing Restrictions: No   Pertinent Vitals/Pain None      Mobility  Bed Mobility Bed Mobility: Rolling Right;Right Sidelying to Sit Rolling Right: 6: Modified independent (Device/Increase time) Right Sidelying to Sit: 6: Modified independent (Device/Increase time) Details for Bed Mobility Assistance: Pt able to perform sequence correctly without any education. Transfers Transfers: Sit to Stand;Stand to Sit Sit to Stand: 6: Modified independent (Device/Increase time) Stand to Sit: 6: Modified independent (Device/Increase time) Ambulation/Gait Ambulation/Gait Assistance: 5: Supervision Ambulation Distance (Feet): 250 Feet Assistive device: None Ambulation/Gait Assistance Details: Verbal cues for tall posture and safety when avoiding obstacles only. Gait Pattern: Step-through pattern;Decreased stride  length Stairs: No Wheelchair Mobility Wheelchair Mobility: No    Shoulder Instructions     Exercises     PT Diagnosis:    PT Problem List:   PT Treatment Interventions:     PT Goals    Visit Information  Last PT Received On: 03/09/12 Assistance Needed: +1    Subjective Data  Subjective: "I am not having any trouble." Patient Stated Goal: Go home.   Prior Functioning  Home Living Lives With: Spouse Available Help at Discharge: Family;Available 24 hours/day Type of Home: Other (Comment) (Condo) Home Access: Level entry;Elevator Home Layout: One level Home Adaptive Equipment: None Prior Function Level of Independence: Independent Able to Take Stairs?: Yes Driving: Yes Vocation: Retired Musician: No difficulties    Cognition  Overall Cognitive Status: Appears within functional limits for tasks assessed/performed Arousal/Alertness: Awake/alert Orientation Level: Appears intact for tasks assessed Behavior During Session: [facility] for tasks performed    Extremity/Trunk Assessment Right Upper Extremity Assessment RUE ROM/Strength/Tone: Within functional levels RUE Sensation: WFL - Light Touch RUE Coordination: WFL - gross/fine motor Left Upper Extremity Assessment LUE ROM/Strength/Tone: Within functional levels LUE Sensation: WFL - Light Touch LUE Coordination: WFL - gross/fine motor Right Lower Extremity Assessment RLE ROM/Strength/Tone: Within functional levels RLE Sensation: WFL - Light Touch RLE Coordination: WFL - gross/fine motor Left Lower Extremity Assessment LLE ROM/Strength/Tone: Within functional levels LLE Sensation: WFL - Light Touch LLE Coordination: WFL - gross/fine motor Trunk Assessment Trunk Assessment: Normal   Balance Balance Balance Assessed: No  End of Session PT - End of Session Equipment Utilized During Treatment: Gait belt;Cervical collar Activity Tolerance: Patient tolerated treatment well Patient left: in bed;with  call bell/phone within reach;with family/visitor present (Sitting EOB.) Nurse Communication: Mobility status  GP Functional Assessment Tool Used: Clinical Judgement. Functional Limitation:  Mobility: Walking and moving around Mobility: Walking and Moving Around Current Status [phone]): At least 1 percent but less than 20 percent impaired, limited or restricted Mobility: Walking and Moving Around Goal Status [phone]): 0 percent impaired, limited or restricted Mobility: Walking and Moving Around Discharge Status [phone]): At least 1 percent but less than 20 percent impaired, limited or restricted   Cephus Shelling 03/09/2012, 8:28 AM  03/09/2012 Cephus Shelling, PT, DPT [phone]

## 2012-03-09 NOTE — Progress Notes (Signed)
Strength full bilateral deltoids, biceps.  Minimal pain.  Doing well. D/C home

## 2012-03-09 NOTE — Discharge Summary (Signed)
Physician Discharge Summary  Patient ID: MATTSON SOUTHERN MRN: [id number] DOB/AGE: 06-17-1945 66 y.o.  Admit date: 03/08/2012 Discharge date: 03/09/2012  Admission Diagnoses: Cervical four-five, five-six, Cervicalgia, Cervical spondylosis, Cervical degenerative disc disease, cervical disc herniation and cervical radiculopathy     Discharge Diagnoses: Cervical four-five, five-six, Cervicalgia, Cervical spondylosis, Cervical degenerative disc disease, cervical disc herniation and cervical radiculopathy s/p Cervical four-five Cervical five six Anterior cervical decompression/diskectomy/fusion with autograft, allograft, plate   Active Problems:  * No active hospital problems. *    Discharged Condition: good  Hospital Course: Yohei Tysdal was admitted with Dx cervical disc herniation with radiculopathy. Following uncomplicated surgery, he recovered in Neuro PACU before transfer to 3500 for overnight observation.   Consults: None  Significant Diagnostic Studies: radiology: XRay intra-operative  Treatments: surgery: Cervical four-five Cervical five six Anterior cervical decompression/diskectomy/fusion with autograft, allograft, plate    Discharge Exam: Blood pressure 112/70, pulse 67, temperature 98.5 F (36.9 C), temperature source Oral, resp. rate 16, SpO2 93.00%. Alert, conversant. Wife present. Soft collar in use. Incision with Dermabond. No erythema, swelling, or drainage. Good strength BUE - improved from pre-op. No dysphagia.     Disposition: 01-Home or Self Care  d/c IV, d/c to home. Pt & wife verbalize understanding of d/c instructions, will call office to schedule 3-4 week f/u appt. Rx's called to [facility] on [address]: Hydrocodone 5-325 1-2 po q6hrs prn pain #60. Valium 5mg  1po q8hrs prn spasm #50.       Medication List     As of 03/09/2012  8:24 AM    ASK your doctor about these medications         ALIGN 4 MG Caps   Take 1 capsule by mouth daily.      aspirin EC 81  MG tablet   Take 81 mg by mouth daily.      cholecalciferol 1000 UNITS tablet   Commonly known as: VITAMIN D   Take 1,000 Units by mouth daily.      fish oil-omega-3 fatty acids 1000 MG capsule   Take 1 g by mouth daily.      losartan-hydrochlorothiazide 100-25 MG per tablet   Commonly known as: HYZAAR   Take 1 tablet by mouth daily.      multivitamin with minerals Tabs   Take 1 tablet by mouth daily.         Signed: Georgiann Cocker 03/09/2012, 8:24 AM  As above

## 2012-03-09 NOTE — Progress Notes (Signed)
Occupational Therapy Note  OT order received and appreciated.  Spoke with RN Morrie Sheldon) and she reports pt has no acute OT needs at this time.  Will sign off but please re-order if pt experiences functional decline.   03/09/2012 Cipriano Mile OTR/L Pager [phone] Office [phone]

## 2012-03-09 NOTE — Progress Notes (Signed)
Pt and wife given D/C instructions, questions answered & verbal understanding given. Pt D/C'd home via wheelchair with wife per MD order. Rema Fendt, RN

## 2012-03-09 NOTE — Progress Notes (Signed)
Subjective: Patient reports "I just had a little soreness between my shoulder blades"  Objective: Vital signs in last 24 hours: Temp:  [97.6 F (36.4 C)-98.2 F (36.8 C)] 98 F (36.7 C) (10/09 0400) Pulse Rate:  [63-99] 66  (10/09 0400) Resp:  [14-19] 16  (10/09 0400) BP: (102-145)/(61-102) 102/61 mmHg (10/09 0400) SpO2:  [93 %-99 %] 94 % (10/09 0400)  Intake/Output from previous day: 10/08 0701 - 10/09 0700 In: 1100 [I.V.:1100] Out: 50 [Blood:50] Intake/Output this shift:    Alert, conversant. Wife present. Soft collar in use. Incision with Dermabond. No erythema, swelling, or drainage. Good strength BUE - improved from pre-op. No dysphagia.   Lab Results: No results found for this basename: WBC:2,HGB:2,HCT:2,PLT:2 in the last 72 hours BMET No results found for this basename: NA:2,K:2,CL:2,CO2:2,GLUCOSE:2,BUN:2,CREATININE:2,CALCIUM:2 in the last 72 hours  Studies/Results: Dg Cervical Spine 2-3 Views  03/08/2012  *RADIOLOGY REPORT*  Clinical Data: C4-C6 ACDF.  CERVICAL SPINE - 2-3 VIEW  Comparison: MRI 01/28/2012  Findings: Image #1 reveals needle localization of C4-5 and C5-6 disc spaces.  Disc degeneration and spondylosis are present at C4-5 and C5-6.  Image #2 reveals ACDF with anterior plate and interbody fusion at C4-5 and C5-6 without acute complication.  IMPRESSION: Satisfactory ACDF C4-5 and C5-6.   Original Report Authenticated By: Camelia Phenes, M.D.     Assessment/Plan: Improved  LOS: 1 day  Per Dr. Venetia Maxon, d/c IV, d/c to home. Pt & wife verbalize understanding of d/c instructions, will call office to schedule 3-4 week f/u appt. Rx's called to [facility] on [address]: Hydrocodone 5-325 1-2 po q6hrs prn pain #60. Valium 5mg  1po q8hrs prn spasm #50.    Georgiann Cocker 03/09/2012, 7:55 AM

## 2012-03-09 NOTE — Progress Notes (Signed)
Referral received for SNF. Chart reviewed and CSW has spoken with RNwho indicates that patient is for DC to home with no d/c needs identified. Pt dc home this a.m.  CSW to sign off.   Lorri Frederick. [place] Pugh  [phone]

## 2012-03-11 ENCOUNTER — Encounter (HOSPITAL_COMMUNITY): Payer: Self-pay | Admitting: Neurosurgery

## 2012-05-15 ENCOUNTER — Encounter (HOSPITAL_COMMUNITY): Payer: Self-pay | Admitting: Physical Medicine and Rehabilitation

## 2012-05-15 ENCOUNTER — Emergency Department (HOSPITAL_COMMUNITY)
Admission: EM | Admit: 2012-05-15 | Discharge: 2012-05-15 | Disposition: A | Discharge status: Home / Self Care | Payer: Medicare Other | Attending: Emergency Medicine | Admitting: Emergency Medicine

## 2012-05-15 ENCOUNTER — Emergency Department (HOSPITAL_COMMUNITY): Payer: Medicare Other

## 2012-05-15 DIAGNOSIS — N201 Calculus of ureter: Secondary | ICD-10-CM | POA: Insufficient documentation

## 2012-05-15 DIAGNOSIS — Z79899 Other long term (current) drug therapy: Secondary | ICD-10-CM | POA: Insufficient documentation

## 2012-05-15 DIAGNOSIS — R1032 Left lower quadrant pain: Secondary | ICD-10-CM | POA: Insufficient documentation

## 2012-05-15 DIAGNOSIS — I10 Essential (primary) hypertension: Secondary | ICD-10-CM | POA: Insufficient documentation

## 2012-05-15 DIAGNOSIS — Z8739 Personal history of other diseases of the musculoskeletal system and connective tissue: Secondary | ICD-10-CM | POA: Insufficient documentation

## 2012-05-15 DIAGNOSIS — Z7982 Long term (current) use of aspirin: Secondary | ICD-10-CM | POA: Insufficient documentation

## 2012-05-15 LAB — URINALYSIS, ROUTINE W REFLEX MICROSCOPIC
Glucose, UA: NEGATIVE mg/dL
Leukocytes, UA: NEGATIVE
Protein, ur: NEGATIVE mg/dL
Specific Gravity, Urine: 1.015 (ref 1.005–1.030)
pH: 7 (ref 5.0–8.0)

## 2012-05-15 LAB — BASIC METABOLIC PANEL
BUN: 24 mg/dL — ABNORMAL HIGH (ref 6–23)
CO2: 27 mEq/L (ref 19–32)
Chloride: 101 mEq/L (ref 96–112)
GFR calc Af Amer: >90 mL/min (ref >90)
Potassium: 3.9 mEq/L (ref 3.5–5.1)

## 2012-05-15 LAB — URINE MICROSCOPIC-ADD ON

## 2012-05-15 LAB — CBC
HCT: 44.2 % (ref 39.0–52.0)
MCV: 94.8 fL (ref 78.0–100.0)
RBC: 4.66 MIL/uL (ref 4.22–5.81)
RDW: 13.1 % (ref 11.5–15.5)
WBC: 11.1 10*3/uL — ABNORMAL HIGH (ref 4.0–10.5)

## 2012-05-15 MED ORDER — ONDANSETRON HCL 4 MG PO TABS: 4.0000 mg | ORAL_TABLET | Freq: Four times a day (QID) | ORAL | Status: DC

## 2012-05-15 MED ORDER — TAMSULOSIN HCL 0.4 MG PO CAPS: 0.4000 mg | ORAL_CAPSULE | Freq: Once | ORAL | Status: DC

## 2012-05-15 MED ORDER — OXYCODONE-ACETAMINOPHEN 5-325 MG PO TABS: 1.0000 | ORAL_TABLET | Freq: Four times a day (QID) | ORAL | Status: DC | PRN

## 2012-05-15 NOTE — ED Notes (Signed)
Pt transported to CT scan. Family remains in exam room.

## 2012-05-15 NOTE — ED Provider Notes (Signed)
History     CSN: [id number]  Arrival date & time 05/15/12  0730   First MD Initiated Contact with Patient 05/15/12 [phone]      Chief Complaint  Patient presents with  . Abdominal Pain    (Consider location/radiation/quality/duration/timing/severity/associated sxs/prior treatment) HPI Pt presents with c/o pain in left lower back and radiating into left lower abdomen.  States pain began abruptly at approx 3am, pain is crescendo decrescendo in nature.  No fever/chills, one episode of vomiting when pain was severe.  No dysuria, no blood in urine.  No change in stools.  He states he has IBS, but symptoms have never felt similar to this.  There are no other associated systemic symptoms, there are no other alleviating or modifying factors.   Past Medical History  Diagnosis Date  . Hypertension   . Arthritis     Past Surgical History  Procedure Date  . Anterior cervical decomp/discectomy fusion 03/08/2012    Procedure: ANTERIOR CERVICAL DECOMPRESSION/DISCECTOMY FUSION 2 LEVELS;  Surgeon: Maeola Harman, MD;  Location: [facility];  Service: Neurosurgery;  Laterality: N/A;  Cervical four-five Cervical five six Anterior cervical decompression/diskectomy/fusion    History reviewed. No pertinent family history.  History  Substance Use Topics  . Smoking status: Never Smoker   . Smokeless tobacco: Not on file  . Alcohol Use: No      Review of Systems ROS reviewed and all otherwise negative except for mentioned in HPI  Allergies  Review of patient's allergies indicates no known allergies.  Home Medications   Current Outpatient Rx  Name  Route  Sig  Dispense  Refill  . ASPIRIN EC 81 MG PO TBEC   Oral   Take 81 mg by mouth daily.         Marland Kitchen VITAMIN D 1000 UNITS PO TABS   Oral   Take 1,000 Units by mouth daily.         . OMEGA-3 FATTY ACIDS 1000 MG PO CAPS   Oral   Take 1 g by mouth daily.         Marland Kitchen LOSARTAN POTASSIUM-HCTZ 100-25 MG PO TABS   Oral   Take 1 tablet by  mouth daily.         . ADULT MULTIVITAMIN W/MINERALS CH   Oral   Take 1 tablet by mouth daily.         Marland Kitchen ALIGN 4 MG PO CAPS   Oral   Take 1 capsule by mouth once a week.          Marland Kitchen ONDANSETRON HCL 4 MG PO TABS   Oral   Take 1 tablet (4 mg total) by mouth every 6 (six) hours.   12 tablet   0   . OXYCODONE-ACETAMINOPHEN 5-325 MG PO TABS   Oral   Take 1-2 tablets by mouth every 6 (six) hours as needed for pain.   15 tablet   0   . TAMSULOSIN HCL 0.4 MG PO CAPS   Oral   Take 1 capsule (0.4 mg total) by mouth once.   14 capsule   0     BP 144/106  Temp 97.5 F (36.4 C) (Oral)  Resp 14  SpO2 100% Vital reviewed Physical Exam Physical Examination: General appearance - alert, well appearing, and in no distress Mental status - alert, oriented to person, place, and time Mouth - mucous membranes moist, pharynx normal without lesions Chest - clear to auscultation, no wheezes, rales or rhonchi, symmetric air entry Heart -  normal rate, regular rhythm, normal S1, S2, no murmurs, rubs, clicks or gallops Back- no CVA tenderness, no midline spinal tenderness Abdomen - soft, nontender, nondistended, no masses or organomegaly Extremities - peripheral pulses normal, no pedal edema, no clubbing or cyanosis Skin - normal coloration and turgor, no rashes  ED Course  Procedures (including critical care time)  Labs Reviewed  URINALYSIS, ROUTINE W REFLEX MICROSCOPIC - Abnormal; Notable for the following:    Hgb urine dipstick SMALL (*)     All other components within normal limits  CBC - Abnormal; Notable for the following:    WBC 11.1 (*)     All other components within normal limits  BASIC METABOLIC PANEL - Abnormal; Notable for the following:    Glucose, Bld 115 (*)     BUN 24 (*)     GFR calc non Af Amer 86 (*)     All other components within normal limits  URINE MICROSCOPIC-ADD ON   Ct Abdomen Pelvis Wo Contrast  05/15/2012  *RADIOLOGY REPORT*  Clinical Data:  Abdominal pain, left-sided pain  CT ABDOMEN AND PELVIS WITHOUT CONTRAST  Technique:  Multidetector CT imaging of the abdomen and pelvis was performed following the standard protocol without intravenous contrast.  Comparison: CT 07/29/2005  Findings:  Renal:  There is an obstructing calculus within the proximal left ureter at the level of the ureteral pelvic junction measuring 7 mm (image #41).  There is inflammatory stranding within the left renal pelvis and mild hydronephrosis.  There is a coarse 10 mm calculus within the renal pelvis.  There is smaller 6 mm calculus within the left kidney.  No evidence of right ureterolithiasis or nephrolithiasis.  Lung bases are clear.  Non-IV contrast images demonstrate no focal hepatic lesion.  The gallbladder, spleen, adrenal glands are normal.  The pancreas is fatty replaced.  The stomach, small bowel, appendix, and colon are normal. Abdominal aorta normal caliber.  No retroperitoneal periportal lymphadenopathy.  No distal ureteral stones or bladder stones.  The prostate gland is normal.  There are diverticula the sigmoid colon without acute inflammation.  No pelvic lymphadenopathy. Review of  bone windows demonstrates no aggressive osseous lesions.  IMPRESSION:  1.  Obstructing calculus at the left ureteral pelvic junction with mild hydronephrosis on the left. 2.  There is inflammatory reaction within the left renal pelvis. Cannot exclude superinfection associated with the obstruction. 3.  Coarse calcification within the left renal pelvis. 4.  Additional left nephrolithiasis. 5.  Of note, the proximal ureteral calculus on the left is evident on the CT scout.   Original Report Authenticated By: Genevive Bi, M.D.      1. Ureteral stone       MDM  Pt presenting with c/o left lower back pain radiating to left lower abdomen, pain is sharp and comes and goes.  No pain currently in the ED.  Left UVJ stone on CT.  Pt has no signs of UTI, will send urine for culture,  renal function is normal.  Will discharged with pain medication and given information for Urology f/u.  Discharged with strict return precautions.  Pt agreeable with plan.       Ethelda Chick, MD 05/15/12 1009

## 2012-05-15 NOTE — ED Notes (Signed)
Pt discharged home, EDP at bedside. Family has several questions regarding follow up at the time.

## 2012-05-15 NOTE — ED Notes (Signed)
Pt presents to department for evaluation of L sided abdominal pain and lower back discomfort. Onset this morning @ 03:00, states sudden onset. Describes pain as intermittent and sharp in nature. 9/10 at the time. He is conscious alert and oriented x4. Skin warm and dry.

## 2012-10-17 IMAGING — CR DG CERVICAL SPINE 2 OR 3 VIEWS
1 series · 1 of 1 positions shown · non-contrast
Comparison: MRI 01/28/2012

CLINICAL DATA: C4-C6 ACDF.

CERVICAL SPINE - 2-3 VIEW

[view not recorded]
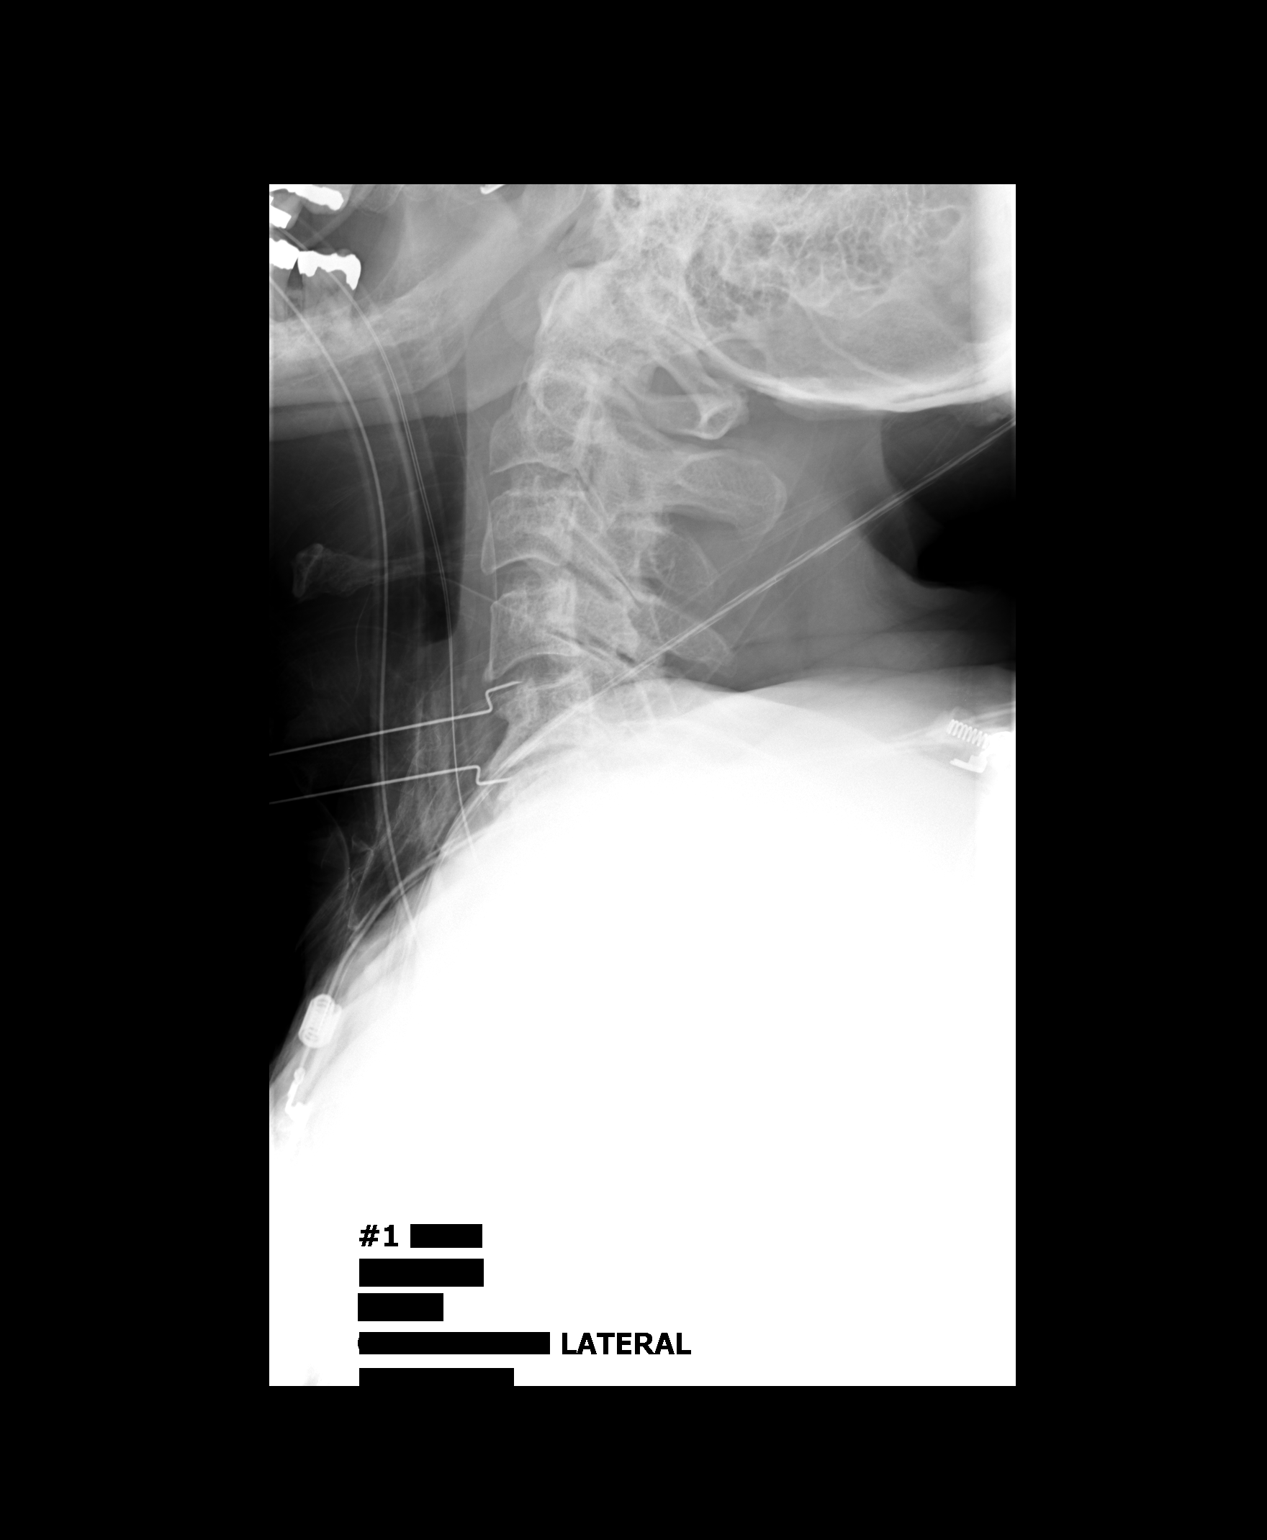

[1 of 1 positions shown; findings below may reference images not displayed]

FINDINGS: Image #1 reveals needle localization of C4-5 and C5-6
disc spaces.  Disc degeneration and spondylosis are present at C4-5
and C5-6.

Image #2 reveals ACDF with anterior plate and interbody fusion at
C4-5 and C5-6 without acute complication.
IMPRESSION: Satisfactory ACDF C4-5 and C5-6.

## 2015-04-04 ENCOUNTER — Other Ambulatory Visit (HOSPITAL_COMMUNITY): Payer: Self-pay | Admitting: Neurosurgery

## 2015-04-19 ENCOUNTER — Encounter (HOSPITAL_COMMUNITY)
Admission: RE | Admit: 2015-04-19 | Discharge: 2015-04-19 | Disposition: A | Discharge status: Home / Self Care | Payer: Medicare Other | Source: Ambulatory Visit | Attending: Neurosurgery | Admitting: Neurosurgery

## 2015-04-19 ENCOUNTER — Encounter (HOSPITAL_COMMUNITY): Payer: Self-pay

## 2015-04-19 DIAGNOSIS — I10 Essential (primary) hypertension: Secondary | ICD-10-CM | POA: Diagnosis not present

## 2015-04-19 DIAGNOSIS — Z01818 Encounter for other preprocedural examination: Secondary | ICD-10-CM | POA: Diagnosis not present

## 2015-04-19 DIAGNOSIS — M4310 Spondylolisthesis, site unspecified: Secondary | ICD-10-CM | POA: Diagnosis not present

## 2015-04-19 DIAGNOSIS — Z01812 Encounter for preprocedural laboratory examination: Secondary | ICD-10-CM | POA: Diagnosis not present

## 2015-04-19 DIAGNOSIS — Z0183 Encounter for blood typing: Secondary | ICD-10-CM | POA: Insufficient documentation

## 2015-04-19 HISTORY — DX: Personal history of urinary calculi: Z87.442

## 2015-04-19 HISTORY — DX: Anxiety disorder, unspecified: F41.9

## 2015-04-19 LAB — SURGICAL PCR SCREEN
MRSA, PCR: NEGATIVE
Staphylococcus aureus: POSITIVE — AB

## 2015-04-19 LAB — ABO/RH: ABO/RH(D): O NEG

## 2015-04-19 LAB — TYPE AND SCREEN
ABO/RH(D): O NEG
Antibody Screen: NEGATIVE

## 2015-04-19 LAB — CBC
HEMATOCRIT: 40.9 % (ref 39.0–52.0)
Hemoglobin: 14.3 g/dL (ref 13.0–17.0)
MCH: 33.6 pg (ref 26.0–34.0)
MCHC: 35 g/dL (ref 30.0–36.0)
MCV: 96 fL (ref 78.0–100.0)
PLATELETS: 211 10*3/uL (ref 150–400)
RBC: 4.26 MIL/uL (ref 4.22–5.81)
RDW: 13.4 % (ref 11.5–15.5)
WBC: 8.7 10*3/uL (ref 4.0–10.5)

## 2015-04-19 LAB — BASIC METABOLIC PANEL
ANION GAP: 9 (ref 5–15)
BUN: 24 mg/dL — ABNORMAL HIGH (ref 6–20)
CALCIUM: 9.8 mg/dL (ref 8.9–10.3)
CO2: 24 mmol/L (ref 22–32)
CREATININE: 1.05 mg/dL (ref 0.61–1.24)
Chloride: 104 mmol/L (ref 101–111)
GFR calc Af Amer: >60 mL/min (ref >60)
GLUCOSE: 86 mg/dL (ref 65–99)
Potassium: 3.8 mmol/L (ref 3.5–5.1)
Sodium: 137 mmol/L (ref 135–145)

## 2015-04-19 NOTE — Pre-Procedure Instructions (Addendum)
Javier Ewing  04/19/2015      [facility] [id number] Javier Ewing- [place],  - [address] AT [facility] OF [address] [place] [address] Phone: [phone] Fax: [phone]  [facility] [id number] Javier Ewing- [place], [place] - [address] AT [facility] OF [address] [place] [address] Phone: [phone] Fax: [phone]    Your procedure is scheduled on 04/23/2015.  [facility]:00 [facility].M.  Call this number if you have problems the morning of surgery:  [phone]   Remember:  Do not eat food or drink liquids after midnight.  On MONDAY  Take these medicines the morning of surgery with A SIP OF WATER --celexa,oxycodone,flomax   Do not wear jewelry   Do not wear lotions, powders, or perfumes.  You may wear deodorant.               Men may shave face and neck.   Do not bring valuables to the hospital.   [facility] is not responsible for any belongings or valuables.  Contacts, dentures or bridgework may not be worn into surgery.  Leave your suitcase in the car.  After surgery it may be brought to your room.  For patients admitted to the hospital, discharge time will be determined by your treatment team.  Patients discharged the day of surgery will not be allowed to drive home.   Name and phone number of your driver:    Special instructions:  Special Instructions: [place] - Preparing for Surgery  Before surgery, you can play an important role.  Because skin is not sterile, your skin needs to be as free of germs as possible.  You can reduce the number of germs on you skin by washing with CHG (chlorahexidine gluconate) soap before surgery.  CHG is an antiseptic cleaner which kills germs and bonds with the skin to continue killing germs even after washing.  Please DO NOT use if you have an allergy to CHG or antibacterial soaps.  If your skin becomes  reddened/irritated stop using the CHG and inform your nurse when you arrive at Short Stay.  Do not shave (including legs and underarms) for at least 48 hours prior to the first CHG shower.  You may shave your face.  Please follow these instructions carefully:   1.  Shower with CHG Soap the night before surgery and the  morning of Surgery.  2.  If you choose to wash your hair, wash your hair first as usual with your  normal shampoo.  3.  After you shampoo, rinse your hair and body thoroughly to remove the  Shampoo.  4.  Use CHG as you would any other liquid soap.  You can apply chg directly to the skin and wash gently with scrungie or a clean washcloth.  5.  Apply the CHG Soap to your body ONLY FROM THE NECK DOWN.    Do not use on open wounds or open sores.  Avoid contact with your eyes, ears, mouth and genitals (private parts).  Wash genitals (private parts)   with your normal soap.  6.  Wash thoroughly, paying special attention to the area where your surgery will be performed.  7.  Thoroughly rinse your body with warm water from the neck down.  8.  DO NOT shower/wash with your normal soap after using and rinsing off   the CHG Soap.  9.  Pat yourself dry with a clean towel.            10.  Wear clean pajamas.            11.  Place clean sheets on your bed the night of your first shower and do not sleep with pets.  Day of Surgery  Do not apply any lotions/deodorants the morning of surgery.  Please wear clean clothes to the hospital/surgery center.  Please read over the following fact sheets that you were given. Pain Booklet, Coughing and Deep Breathing, Blood Transfusion Information, MRSA Information and Surgical Site Infection Prevention

## 2015-04-21 ENCOUNTER — Encounter (HOSPITAL_COMMUNITY): Payer: Self-pay | Admitting: Neurosurgery

## 2015-04-21 NOTE — H&P (Signed)
Patient ID:   [id number] Patient: Javier Ewing  Date of Birth: 1945-08-11 Visit Type: Office Visit   Date: 04/04/2015 03:15 PM Provider: Danae Orleans. Venetia Maxon MD   This 69 year old male presents for back pain.  History of Present Illness: 1.  back pain  The patient returns today to review his MRI of his lumbar spine.  This demonstrates an L4 L5 spondylolisthesis with significant foraminal disc herniation at the L4-L5 level on the left causing significant left L4 nerve root compression his symptoms are actually worse in the L5 distribution and I believe this is due to severe stenosis in the lateral recess at this level where he is causing significant nerve root compression of the L5 nerve root on the left.  In addition he has marked degenerative disc pathology at the L5-S1 level with decreased foraminal height and marked endplate degenerative changes with Modic changes.  He has relatively advanced lateral recess and foraminal stenosis at this level as well.  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  Given the severity of the patient's imaging studies I have recommended to him that he undergo decompression and fusion at the L4 L5 and L5-S1 levels.  I do not believe there are good nonsurgical options for him and given the significant weakness on his examination I do not think that injections or physical therapy will give him any benefit whatsoever.  In fact I think an injection would likely cause him exacerbated pain rather than pain relief.  We went over specifics of surgery and went over models and answered his and his wife's questions.  The plan is to proceed with decompression and fusion at the L4 through sacral levels on 1122/16.  The patient understands risks and benefits and wishes to proceed.      Medical/Surgical/Interim History Reviewed, no change.  Last detailed document date:04/01/2015.   PAST MEDICAL HISTORY, SURGICAL HISTORY, FAMILY HISTORY,  SOCIAL HISTORY AND REVIEW OF SYSTEMS I have reviewed the patient's past medical, surgical, family and social history as well as the comprehensive review of systems as included on the [facility] history form dated 11/26/2014, which I have signed.  Family History: Reviewed, no changes.  Last detailed document: 04/01/2015.  Patient reports there is no relevant family history.   Social History: Tobacco use reviewed. Reviewed, no changes. Last detailed document date: 04/01/2015.      MEDICATIONS(added, continued or stopped this visit): Started Medication Directions Instruction Stopped   Aleve 220 mg tablet take 1 tablet by oral route  every 12 hours as needed     aspirin 81 mg tablet,delayed release take 1 tablet by oral route  every day     Fish oil Take daily     Flomax 0.4 mg capsule take 1 capsule by oral route  every day 1/2 hour following the same meal each day     losartan 100 mg-hydrochlorothiazide 12.5 mg tablet take 1 tablet by oral route  every day     multivitamin Take daily     Vitamin D3 Take daily       ALLERGIES: Ingredient Reaction Medication Name Comment  NO KNOWN ALLERGIES     No known allergies. Reviewed, no changes.    Vitals Date Temp F BP Pulse Ht In Wt Lb BMI BSA Pain Score  04/04/2015  122/78 90 70 168 24.11  2/10      IMPRESSION Spondylolisthesis L4-L5 with disc herniation and marked spinal stenosis along  with significant disc degeneration at the L5-S1 level  Assessment/Plan # Detail Type Description   1. Assessment Low back pain, unspecified back pain laterality, with sciatica presence unspecified (M54.5).       2. 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       3. Assessment Radiculopathy, lumbar region (M54.16).       4. Assessment Spinal stenosis of lumbar region (M48.06).         Pain Assessment/Treatment Pain Scale: 2/10. Method: Numeric Pain Intensity Scale. Location: back. Onset:  04/03/2014. Duration: varies. Quality: discomforting. Pain Assessment/Treatment follow-up plan of care: Patient is taking medications as prescribed..  Fall Risk Plan The patient has not fallen in the last year.  Proceed with decompression and fusion at the L45 and L5-S1 levels.  From this under MAS technique.  Orders: Diagnostic Procedures: Assessment Procedure  M43.16 Lumbar Spine- AP/Lat             Provider:  Danae OrleansJoseph D. Venetia MaxonStern MD  04/04/2015 04:36 PM Dictation edited by: Danae OrleansJoseph D. Venetia MaxonStern    CC Providers: Geoffry Paradiseichard Aronson [facility] [address] [place], [place] [address]-              Electronically signed by Danae OrleansJoseph D. Venetia MaxonStern MD on 04/04/2015 04:36 PM  Patient ID:   [id number] Patient: Javier Ewing  Date of Birth: 1945-09-23 Visit Type: Office Visit   Date: 04/01/2015 01:45 PM Provider: Danae OrleansJoseph D. Venetia MaxonStern MD   This 69 year old male presents for back pain.  History of Present Illness: 1.  back pain  The patient is doing well from the standpoint of his cervical spine.  On his left buttock about a year ago and since that time he has had severe pain into the top of his foot and down his left leg.  The patient had lumbar spine x-rays which demonstrate anterolisthesis of L4 and L5 L1 14.5 mm.  On examination the patient has significant left hip abductor weakness at 4 out of 5 and left extensor hallucis longus weakness at 4 out of 5.  He has a positive straight leg raise on the left.  He is able to bend within 12 inches of the floor to his upper extremities outstretched and is able to stand on his heels and toes.  The patient describes that he is quite miserable pain and is not able to function in his current state.        PAST MEDICAL/SURGICAL HISTORY   (Reviewed, updated)  Disease/disorder Onset Date Management Date Comments  Depression      Hypertension         PAST MEDICAL HISTORY, SURGICAL HISTORY, FAMILY HISTORY, SOCIAL HISTORY  AND REVIEW OF SYSTEMS I have reviewed the patient's past medical, surgical, family and social history as well as the comprehensive review of systems as included on the [facility] history form dated 11/26/2014, which I have signed.  Family History  (Reviewed, updated)   SOCIAL HISTORY  (Reviewed, updated) Tobacco use reviewed. Preferred language is Unknown.   Smoking status: Never smoker.  SMOKING STATUS Use Status Type Smoking Status Usage Per Day Years Used Total Pack Years  no/never  Never smoker       HOME ENVIRONMENT/SAFETY The patient has not fallen in the last year.        MEDICATIONS(added, continued or stopped this visit): Started Medication Directions Instruction Stopped   Aleve 220 mg tablet take 1 tablet by oral route  every 12 hours as needed  aspirin 81 mg tablet,delayed release take 1 tablet by oral route  every day     Fish oil Take daily     Flomax 0.4 mg capsule take 1 capsule by oral route  every day 1/2 hour following the same meal each day     losartan 100 mg-hydrochlorothiazide 12.5 mg tablet take 1 tablet by oral route  every day     multivitamin Take daily     Vitamin D3 Take daily     Medication Comment:  Patient does not have medication list.   ALLERGIES: Ingredient Reaction Medication Name Comment  NO KNOWN ALLERGIES     No known allergies.    Vitals Date Temp F BP Pulse Ht In Wt Lb BMI BSA Pain Score  04/01/2015  114/69 93 70 168 24.11  0/10      IMPRESSION The patient has a significant grade 2 spondylolisthesis of L4 and L5 with marked disc degeneration at the L5-S1 level.  He has a marked left L5 radiculopathy.  I recommended that he undergo imaging including MRI and lumbar spine radiographs with flexion and extension views.  I will see him back after this had been done and make further recommendations.  Completed Orders (this encounter) Order Details Reason Side Interpretation Result Initial  Treatment Date Region  Lumbar Spine- AP/Lat/Flex/Ex      04/01/2015    Assessment/Plan # Detail Type Description   1. 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       2. Assessment Spinal stenosis of lumbar region (M48.06).       3. Assessment Low back pain, unspecified back pain laterality, with sciatica presence unspecified (M54.5).       4. Assessment Radiculopathy, lumbar region (M54.16).         Pain Assessment/Treatment Pain Scale: 0/10. Method: Numeric Pain Intensity Scale.  Fall Risk Plan The patient has not fallen in the last year.  Patient will have lumbar spinal imaging and follow-up with me after that is done.  Orders: Diagnostic Procedures: Assessment Procedure  M43.16 Lumbar Spine- AP/Lat/Obls/Spot/Flex/Ex  M43.16 MRI Spine/lumb W/o Contrast  M54.16 Lumbar Spine- AP/Lat/Flex/Ex             Provider:  Danae Orleans. Venetia Maxon MD  04/04/2015 04:38 PM Dictation edited by: Danae Orleans. Venetia Maxon    CC Providers: Geoffry Paradise [facility] [address] [place], [place] [address]-              Electronically signed by Danae Orleans Venetia Maxon MD on 04/04/2015 04:38 PM

## 2015-04-22 ENCOUNTER — Encounter (HOSPITAL_COMMUNITY): Payer: Self-pay

## 2015-04-22 MED ORDER — CEFAZOLIN SODIUM-DEXTROSE 2-3 GM-% IV SOLR
2.0000 g | INTRAVENOUS | Status: AC
  Administered 2015-04-23 (×2): 2 g via INTRAVENOUS
  Filled 2015-04-22: qty 50

## 2015-04-22 NOTE — Progress Notes (Addendum)
Anesthesia Chart Review: Patient is a 69 year old male scheduled for L4-5, L5-S1 MAS, PLIF on 04/23/15 by Dr. Venetia MaxonStern.  History includes never smoker, HTN, anxiety, depression, nephrolithiasis s/p laser lithotripsy with left ureteral stent insertion 03/05/15, C4-6 ACDF '13. Local PCP is Dr. Jacky KindleAronson with [facility] although [facility] and [facility] for "[facility]."  Some records by Dr. Radonna Rickeravid Sawyer can be viewed in Care Everywhere.  08/22/14 (question date, partially cut off fax--[facility]) EKG: SR, LAD. Dr. Lorenso CourierSawyer's office did send EKG tracings from his 10/05/14 stress test that showed NSR.  10/01/14 24 hour holter monitor ([facility]; see Care Everywherer):  Findings: 1. Baseline rhythm: Normal sinus rhythm 2. Occasional probably physiologic sinus bradycardia during sleep. The minimum heart rate was 46 bpm 3. One isolated PVC in 24 hours 4. Rare isolated PACs 5. No sustained arrhythmias. No atrial fibrillation 6. No symptoms reported by the patient while wearing the Holter monitor Conclusion: Benign Holter monitor  10/05/14 Nuclear stress test (Dr. Radonna Rickeravid Sawyer, [facility]; see Care Everywhere): Conclusions: Normal myocardial perfusion study without evidence of inducible ischemia or infarction. Normal LV wall motion and segmental function. Post stress EF 62%. (10/05/14 telephone encounter by Dr. Zenda AlpersSawyer indicates that patient does not need additional cardiology follow-up.)  08/22/14 CXR ([facility]): No acute cardiopulmonary disease.  Preoperative labs noted.   Patient with recent cardiology visit with testing. He was told fluttering was likely due to anxiety. If no acute changes then I would anticipate that he could proceed as planned.  Velna Ochsllison Girolamo Lortie, PA-C [facility] Short Stay [facility]/Anesthesiology Phone [phone] 04/22/2015 4:18 PM

## 2015-04-23 ENCOUNTER — Inpatient Hospital Stay (HOSPITAL_COMMUNITY): Payer: Medicare Other | Admitting: Anesthesiology

## 2015-04-23 ENCOUNTER — Inpatient Hospital Stay (HOSPITAL_COMMUNITY): Payer: Medicare Other

## 2015-04-23 ENCOUNTER — Inpatient Hospital Stay (HOSPITAL_COMMUNITY)
Admission: RE | Admit: 2015-04-23 | Discharge: 2015-04-25 | DRG: 460 | Disposition: A | Discharge status: Home / Self Care | Payer: Medicare Other | Source: Ambulatory Visit | Attending: Neurosurgery | Admitting: Neurosurgery

## 2015-04-23 ENCOUNTER — Encounter (HOSPITAL_COMMUNITY): Payer: Self-pay | Admitting: Surgery

## 2015-04-23 ENCOUNTER — Inpatient Hospital Stay (HOSPITAL_COMMUNITY): Payer: Medicare Other | Admitting: Vascular Surgery

## 2015-04-23 ENCOUNTER — Encounter (HOSPITAL_COMMUNITY)
Admission: RE | Disposition: A | Discharge status: Home / Self Care | Payer: Medicare Other | Source: Ambulatory Visit | Attending: Neurosurgery

## 2015-04-23 DIAGNOSIS — M5117 Intervertebral disc disorders with radiculopathy, lumbosacral region: Secondary | ICD-10-CM | POA: Diagnosis present

## 2015-04-23 DIAGNOSIS — F329 Major depressive disorder, single episode, unspecified: Secondary | ICD-10-CM | POA: Diagnosis present

## 2015-04-23 DIAGNOSIS — Z419 Encounter for procedure for purposes other than remedying health state, unspecified: Secondary | ICD-10-CM

## 2015-04-23 DIAGNOSIS — I10 Essential (primary) hypertension: Secondary | ICD-10-CM | POA: Diagnosis present

## 2015-04-23 DIAGNOSIS — M5116 Intervertebral disc disorders with radiculopathy, lumbar region: Secondary | ICD-10-CM | POA: Diagnosis present

## 2015-04-23 DIAGNOSIS — M4726 Other spondylosis with radiculopathy, lumbar region: Secondary | ICD-10-CM | POA: Diagnosis present

## 2015-04-23 DIAGNOSIS — M4316 Spondylolisthesis, lumbar region: Secondary | ICD-10-CM | POA: Diagnosis present

## 2015-04-23 HISTORY — PX: MAXIMUM ACCESS (MAS)POSTERIOR LUMBAR INTERBODY FUSION (PLIF) 2 LEVEL: SHX6369

## 2015-04-23 SURGERY — FOR MAXIMUM ACCESS (MAS) POSTERIOR LUMBAR INTERBODY FUSION (PLIF) 2 LEVEL
Anesthesia: General | Site: Spine Lumbar

## 2015-04-23 MED ORDER — OXYCODONE-ACETAMINOPHEN 5-325 MG PO TABS: 1.0000 | ORAL_TABLET | Freq: Four times a day (QID) | ORAL | Status: DC | PRN

## 2015-04-23 MED ORDER — HYDROCODONE-ACETAMINOPHEN 5-325 MG PO TABS: 1.0000 | ORAL_TABLET | ORAL | Status: DC | PRN

## 2015-04-23 MED ORDER — ACETAMINOPHEN 650 MG RE SUPP: 650.0000 mg | RECTAL | Status: DC | PRN

## 2015-04-23 MED ORDER — PHENYLEPHRINE HCL 10 MG/ML IJ SOLN
INTRAMUSCULAR | Status: DC | PRN
  Administered 2015-04-23 (×2): 80 ug via INTRAVENOUS

## 2015-04-23 MED ORDER — DOCUSATE SODIUM 100 MG PO CAPS
100.0000 mg | ORAL_CAPSULE | Freq: Two times a day (BID) | ORAL | Status: DC
Start: 2015-04-23 — End: 2015-04-25
  Administered 2015-04-23 – 2015-04-24 (×3): 100 mg via ORAL
  Filled 2015-04-23 (×4): qty 1

## 2015-04-23 MED ORDER — PHENYLEPHRINE 40 MCG/ML (10ML) SYRINGE FOR IV PUSH (FOR BLOOD PRESSURE SUPPORT)
PREFILLED_SYRINGE | INTRAVENOUS | Status: AC
  Filled 2015-04-23: qty 20

## 2015-04-23 MED ORDER — LACTATED RINGERS IV SOLN
INTRAVENOUS | Status: DC | PRN
  Administered 2015-04-23 (×3): via INTRAVENOUS

## 2015-04-23 MED ORDER — ONDANSETRON HCL 4 MG/2ML IJ SOLN: 4.0000 mg | INTRAMUSCULAR | Status: DC | PRN

## 2015-04-23 MED ORDER — POLYETHYLENE GLYCOL 3350 17 G PO PACK: 17.0000 g | PACK | Freq: Every day | ORAL | Status: DC | PRN

## 2015-04-23 MED ORDER — ALBUMIN HUMAN 5 % IV SOLN
INTRAVENOUS | Status: DC | PRN
  Administered 2015-04-23: 14:00:00 via INTRAVENOUS

## 2015-04-23 MED ORDER — BUPIVACAINE HCL (PF) 0.5 % IJ SOLN
INTRAMUSCULAR | Status: DC | PRN
  Administered 2015-04-23: 5 mL

## 2015-04-23 MED ORDER — BUPIVACAINE LIPOSOME 1.3 % IJ SUSP
INTRAMUSCULAR | Status: DC | PRN
  Administered 2015-04-23: 20 mL

## 2015-04-23 MED ORDER — BISACODYL 10 MG RE SUPP: 10.0000 mg | Freq: Every day | RECTAL | Status: DC | PRN

## 2015-04-23 MED ORDER — HYDROMORPHONE HCL 1 MG/ML IJ SOLN: 0.5000 mg | INTRAMUSCULAR | Status: DC | PRN

## 2015-04-23 MED ORDER — GLYCOPYRROLATE 0.2 MG/ML IJ SOLN
INTRAMUSCULAR | Status: DC | PRN
  Administered 2015-04-23: 0.4 mg via INTRAVENOUS

## 2015-04-23 MED ORDER — SUCCINYLCHOLINE CHLORIDE 20 MG/ML IJ SOLN
INTRAMUSCULAR | Status: AC
  Filled 2015-04-23: qty 1

## 2015-04-23 MED ORDER — SODIUM CHLORIDE 0.9 % IJ SOLN
INTRAMUSCULAR | Status: AC
  Filled 2015-04-23: qty 10

## 2015-04-23 MED ORDER — CEFAZOLIN SODIUM-DEXTROSE 2-3 GM-% IV SOLR
INTRAVENOUS | Status: AC
  Filled 2015-04-23: qty 50

## 2015-04-23 MED ORDER — SUCCINYLCHOLINE CHLORIDE 20 MG/ML IJ SOLN
INTRAMUSCULAR | Status: DC | PRN
  Administered 2015-04-23: 50 mg via INTRAVENOUS

## 2015-04-23 MED ORDER — EPHEDRINE SULFATE 50 MG/ML IJ SOLN
INTRAMUSCULAR | Status: AC
  Filled 2015-04-23: qty 1

## 2015-04-23 MED ORDER — TAMSULOSIN HCL 0.4 MG PO CAPS
0.4000 mg | ORAL_CAPSULE | Freq: Once | ORAL | Status: AC
  Administered 2015-04-23: 0.4 mg via ORAL
  Filled 2015-04-23: qty 1

## 2015-04-23 MED ORDER — HYDROCHLOROTHIAZIDE 12.5 MG PO CAPS
12.5000 mg | ORAL_CAPSULE | Freq: Every day | ORAL | Status: DC
  Administered 2015-04-23 – 2015-04-24 (×2): 12.5 mg via ORAL
  Filled 2015-04-23 (×3): qty 1

## 2015-04-23 MED ORDER — SODIUM CHLORIDE 0.9 % IJ SOLN: 3.0000 mL | INTRAMUSCULAR | Status: DC | PRN

## 2015-04-23 MED ORDER — DEXAMETHASONE SODIUM PHOSPHATE 10 MG/ML IJ SOLN
INTRAMUSCULAR | Status: DC | PRN
  Administered 2015-04-23: 10 mg via INTRAVENOUS

## 2015-04-23 MED ORDER — LACTATED RINGERS IV SOLN
INTRAVENOUS | Status: DC
  Administered 2015-04-23: 10:00:00 via INTRAVENOUS

## 2015-04-23 MED ORDER — ASPIRIN EC 81 MG PO TBEC
81.0000 mg | DELAYED_RELEASE_TABLET | Freq: Every day | ORAL | Status: DC
  Administered 2015-04-24: 81 mg via ORAL
  Filled 2015-04-23 (×2): qty 1

## 2015-04-23 MED ORDER — LIDOCAINE HCL (CARDIAC) 20 MG/ML IV SOLN
INTRAVENOUS | Status: DC | PRN
  Administered 2015-04-23: 70 mg via INTRAVENOUS

## 2015-04-23 MED ORDER — THROMBIN 20000 UNITS EX SOLR
CUTANEOUS | Status: DC | PRN
  Administered 2015-04-23: 13:00:00 via TOPICAL

## 2015-04-23 MED ORDER — ONDANSETRON HCL 4 MG PO TABS
4.0000 mg | ORAL_TABLET | Freq: Four times a day (QID) | ORAL | Status: DC
  Administered 2015-04-24 (×2): 4 mg via ORAL
  Filled 2015-04-23 (×3): qty 1

## 2015-04-23 MED ORDER — KCL IN DEXTROSE-NACL 20-5-0.45 MEQ/L-%-% IV SOLN
INTRAVENOUS | Status: DC
Start: 2015-04-23 — End: 2015-04-25
  Filled 2015-04-23 (×4): qty 1000

## 2015-04-23 MED ORDER — FENTANYL CITRATE (PF) 100 MCG/2ML IJ SOLN
INTRAMUSCULAR | Status: DC | PRN
  Administered 2015-04-23: 50 ug via INTRAVENOUS
  Administered 2015-04-23: 100 ug via INTRAVENOUS
  Administered 2015-04-23 (×2): 50 ug via INTRAVENOUS
  Administered 2015-04-23: 150 ug via INTRAVENOUS
  Administered 2015-04-23 (×2): 50 ug via INTRAVENOUS

## 2015-04-23 MED ORDER — MIDAZOLAM HCL 2 MG/2ML IJ SOLN
INTRAMUSCULAR | Status: AC
  Filled 2015-04-23: qty 2

## 2015-04-23 MED ORDER — OXYCODONE-ACETAMINOPHEN 5-325 MG PO TABS
1.0000 | ORAL_TABLET | ORAL | Status: DC | PRN
  Administered 2015-04-23 – 2015-04-25 (×9): 2 via ORAL
  Filled 2015-04-23 (×9): qty 2

## 2015-04-23 MED ORDER — MUPIROCIN 2 % EX OINT
TOPICAL_OINTMENT | Freq: Two times a day (BID) | CUTANEOUS | Status: DC
  Administered 2015-04-23 – 2015-04-24 (×3): via NASAL

## 2015-04-23 MED ORDER — EPHEDRINE SULFATE 50 MG/ML IJ SOLN
INTRAMUSCULAR | Status: DC | PRN
  Administered 2015-04-23 (×2): 10 mg via INTRAVENOUS

## 2015-04-23 MED ORDER — LOSARTAN POTASSIUM 50 MG PO TABS
100.0000 mg | ORAL_TABLET | Freq: Every day | ORAL | Status: DC
  Administered 2015-04-23 – 2015-04-24 (×2): 100 mg via ORAL
  Filled 2015-04-23 (×3): qty 2

## 2015-04-23 MED ORDER — METHOCARBAMOL 500 MG PO TABS
500.0000 mg | ORAL_TABLET | Freq: Four times a day (QID) | ORAL | Status: DC | PRN
  Administered 2015-04-23 – 2015-04-25 (×5): 500 mg via ORAL
  Filled 2015-04-23 (×5): qty 1

## 2015-04-23 MED ORDER — METHOCARBAMOL 1000 MG/10ML IJ SOLN: 500.0000 mg | Freq: Four times a day (QID) | INTRAVENOUS | Status: DC | PRN

## 2015-04-23 MED ORDER — PROPOFOL 500 MG/50ML IV EMUL
INTRAVENOUS | Status: DC | PRN
  Administered 2015-04-23: 50 ug/kg/min via INTRAVENOUS

## 2015-04-23 MED ORDER — BUPIVACAINE LIPOSOME 1.3 % IJ SUSP
20.0000 mL | Freq: Once | INTRAMUSCULAR | Status: DC
  Filled 2015-04-23: qty 20

## 2015-04-23 MED ORDER — PANTOPRAZOLE SODIUM 40 MG IV SOLR
40.0000 mg | Freq: Every day | INTRAVENOUS | Status: DC
  Administered 2015-04-23: 40 mg via INTRAVENOUS
  Filled 2015-04-23: qty 40

## 2015-04-23 MED ORDER — FENTANYL CITRATE (PF) 250 MCG/5ML IJ SOLN
INTRAMUSCULAR | Status: AC
  Filled 2015-04-23: qty 10

## 2015-04-23 MED ORDER — VITAMIN D 1000 UNITS PO TABS
1000.0000 [IU] | ORAL_TABLET | Freq: Every day | ORAL | Status: DC
  Administered 2015-04-24: 1000 [IU] via ORAL
  Filled 2015-04-23 (×2): qty 1

## 2015-04-23 MED ORDER — KCL IN DEXTROSE-NACL 20-5-0.45 MEQ/L-%-% IV SOLN
INTRAVENOUS | Status: AC
  Filled 2015-04-23: qty 1000

## 2015-04-23 MED ORDER — ONDANSETRON HCL 4 MG/2ML IJ SOLN
INTRAMUSCULAR | Status: DC | PRN
  Administered 2015-04-23: 4 mg via INTRAVENOUS

## 2015-04-23 MED ORDER — MENTHOL 3 MG MT LOZG
1.0000 | LOZENGE | OROMUCOSAL | Status: DC | PRN
  Filled 2015-04-23: qty 9

## 2015-04-23 MED ORDER — LOSARTAN POTASSIUM-HCTZ 100-12.5 MG PO TABS: 1.0000 | ORAL_TABLET | Freq: Every day | ORAL | Status: DC

## 2015-04-23 MED ORDER — ARTIFICIAL TEARS OP OINT
TOPICAL_OINTMENT | OPHTHALMIC | Status: DC | PRN
  Administered 2015-04-23: 1 via OPHTHALMIC

## 2015-04-23 MED ORDER — ADULT MULTIVITAMIN W/MINERALS CH
1.0000 | ORAL_TABLET | Freq: Every day | ORAL | Status: DC
  Administered 2015-04-24: 1 via ORAL
  Filled 2015-04-23 (×2): qty 1

## 2015-04-23 MED ORDER — PHENOL 1.4 % MT LIQD: 1.0000 | OROMUCOSAL | Status: DC | PRN

## 2015-04-23 MED ORDER — MIDAZOLAM HCL 5 MG/5ML IJ SOLN
INTRAMUSCULAR | Status: DC | PRN
  Administered 2015-04-23: 2 mg via INTRAVENOUS

## 2015-04-23 MED ORDER — DEXAMETHASONE SODIUM PHOSPHATE 10 MG/ML IJ SOLN
INTRAMUSCULAR | Status: AC
  Filled 2015-04-23: qty 1

## 2015-04-23 MED ORDER — ACETAMINOPHEN 325 MG PO TABS: 650.0000 mg | ORAL_TABLET | ORAL | Status: DC | PRN

## 2015-04-23 MED ORDER — ALIGN 4 MG PO CAPS: 1.0000 | ORAL_CAPSULE | ORAL | Status: DC

## 2015-04-23 MED ORDER — PHENYLEPHRINE 40 MCG/ML (10ML) SYRINGE FOR IV PUSH (FOR BLOOD PRESSURE SUPPORT)
PREFILLED_SYRINGE | INTRAVENOUS | Status: AC
  Filled 2015-04-23: qty 10

## 2015-04-23 MED ORDER — CEFAZOLIN SODIUM 1-5 GM-% IV SOLN
1.0000 g | Freq: Three times a day (TID) | INTRAVENOUS | Status: AC
  Administered 2015-04-23 – 2015-04-24 (×2): 1 g via INTRAVENOUS
  Filled 2015-04-23 (×2): qty 50

## 2015-04-23 MED ORDER — LOSARTAN POTASSIUM-HCTZ 100-25 MG PO TABS: 1.0000 | ORAL_TABLET | Freq: Every day | ORAL | Status: DC

## 2015-04-23 MED ORDER — ALUM & MAG HYDROXIDE-SIMETH 200-200-20 MG/5ML PO SUSP: 30.0000 mL | Freq: Four times a day (QID) | ORAL | Status: DC | PRN

## 2015-04-23 MED ORDER — CITALOPRAM HYDROBROMIDE 10 MG PO TABS
10.0000 mg | ORAL_TABLET | Freq: Every day | ORAL | Status: DC
Start: 2015-04-23 — End: 2015-04-25
  Administered 2015-04-23 – 2015-04-24 (×2): 10 mg via ORAL
  Filled 2015-04-23 (×3): qty 1

## 2015-04-23 MED ORDER — FLEET ENEMA 7-19 GM/118ML RE ENEM: 1.0000 | ENEMA | Freq: Once | RECTAL | Status: DC | PRN

## 2015-04-23 MED ORDER — LIDOCAINE-EPINEPHRINE 1 %-1:100000 IJ SOLN
INTRAMUSCULAR | Status: DC | PRN
  Administered 2015-04-23: 5 mL

## 2015-04-23 MED ORDER — PROPOFOL 10 MG/ML IV BOLUS
INTRAVENOUS | Status: AC
  Filled 2015-04-23: qty 40

## 2015-04-23 MED ORDER — 0.9 % SODIUM CHLORIDE (POUR BTL) OPTIME
TOPICAL | Status: DC | PRN
  Administered 2015-04-23: 1000 mL

## 2015-04-23 MED ORDER — SODIUM CHLORIDE 0.9 % IJ SOLN
3.0000 mL | Freq: Two times a day (BID) | INTRAMUSCULAR | Status: DC
Start: 2015-04-23 — End: 2015-04-25
  Administered 2015-04-23: 3 mL via INTRAVENOUS

## 2015-04-23 MED ORDER — PROPOFOL 10 MG/ML IV BOLUS
INTRAVENOUS | Status: DC | PRN
  Administered 2015-04-23: 50 mg via INTRAVENOUS
  Administered 2015-04-23: 140 mg via INTRAVENOUS
  Administered 2015-04-23: 60 mg via INTRAVENOUS

## 2015-04-23 MED ORDER — SODIUM CHLORIDE 0.9 % IV SOLN
10.0000 mg | INTRAVENOUS | Status: DC | PRN
  Administered 2015-04-23: 25 ug/min via INTRAVENOUS

## 2015-04-23 SURGICAL SUPPLY — 85 items
ADH SKN CLS APL DERMABOND .7 (GAUZE/BANDAGES/DRESSINGS) ×1
APL SKNCLS STERI-STRIP NONHPOA (GAUZE/BANDAGES/DRESSINGS)
BENZOIN TINCTURE PRP APPL 2/3 (GAUZE/BANDAGES/DRESSINGS) IMPLANT
BLADE CLIPPER SURG (BLADE) IMPLANT
BONE CANC CHIPS 40CC CAN1/2 (Bone Implant) ×2 IMPLANT
BONE MATRIX OSTEOCEL PRO MED (Bone Implant) ×1 IMPLANT
BUR MATCHSTICK NEURO 3.0 LAGG (BURR) ×2 IMPLANT
BUR PRECISION FLUTE 5.0 (BURR) IMPLANT
BUR ROUND FLUTED 5 RND (BURR) ×2 IMPLANT
CAGE COROENT LG 10X9X23-12 (Cage) ×2 IMPLANT
CAGE COROENT MP 8X9X23M-8 SPIN (Cage) ×2 IMPLANT
CANISTER SUCT 3000ML PPV (MISCELLANEOUS) ×2 IMPLANT
CHIPS CANC BONE 40CC CAN1/2 (Bone Implant) ×1 IMPLANT
CLIP NEUROVISION LG (CLIP) ×1 IMPLANT
CONT SPEC 4OZ CLIKSEAL STRL BL (MISCELLANEOUS) ×2 IMPLANT
COVER BACK TABLE 24X17X13 BIG (DRAPES) IMPLANT
COVER BACK TABLE 60X90IN (DRAPES) ×2 IMPLANT
DECANTER SPIKE VIAL GLASS SM (MISCELLANEOUS) ×2 IMPLANT
DERMABOND ADVANCED (GAUZE/BANDAGES/DRESSINGS) ×1
DERMABOND ADVANCED .7 DNX12 (GAUZE/BANDAGES/DRESSINGS) ×1 IMPLANT
DRAPE C-ARM 42X72 X-RAY (DRAPES) ×2 IMPLANT
DRAPE C-ARMOR (DRAPES) ×2 IMPLANT
DRAPE LAPAROTOMY 100X72X124 (DRAPES) ×2 IMPLANT
DRAPE POUCH INSTRU U-SHP 10X18 (DRAPES) ×2 IMPLANT
DRAPE SURG 17X23 STRL (DRAPES) ×2 IMPLANT
DRSG OPSITE POSTOP 4X6 (GAUZE/BANDAGES/DRESSINGS) ×2 IMPLANT
DURAPREP 26ML APPLICATOR (WOUND CARE) ×2 IMPLANT
ELECT REM PT RETURN 9FT ADLT (ELECTROSURGICAL) ×2
ELECTRODE REM PT RTRN 9FT ADLT (ELECTROSURGICAL) ×1 IMPLANT
EVACUATOR 1/8 PVC DRAIN (DRAIN) IMPLANT
GAUZE SPONGE 4X4 12PLY STRL (GAUZE/BANDAGES/DRESSINGS) ×2 IMPLANT
GAUZE SPONGE 4X4 16PLY XRAY LF (GAUZE/BANDAGES/DRESSINGS) IMPLANT
GLOVE BIO SURGEON STRL SZ8 (GLOVE) ×4 IMPLANT
GLOVE BIOGEL PI IND STRL 8 (GLOVE) ×2 IMPLANT
GLOVE BIOGEL PI IND STRL 8.5 (GLOVE) ×2 IMPLANT
GLOVE BIOGEL PI INDICATOR 8 (GLOVE) ×2
GLOVE BIOGEL PI INDICATOR 8.5 (GLOVE) ×2
GLOVE ECLIPSE 8.0 STRL XLNG CF (GLOVE) ×4 IMPLANT
GLOVE EXAM NITRILE LRG STRL (GLOVE) IMPLANT
GLOVE EXAM NITRILE MD LF STRL (GLOVE) IMPLANT
GLOVE EXAM NITRILE XL STR (GLOVE) IMPLANT
GLOVE EXAM NITRILE XS STR PU (GLOVE) IMPLANT
GOWN STRL REUS W/ TWL LRG LVL3 (GOWN DISPOSABLE) IMPLANT
GOWN STRL REUS W/ TWL XL LVL3 (GOWN DISPOSABLE) ×3 IMPLANT
GOWN STRL REUS W/TWL 2XL LVL3 (GOWN DISPOSABLE) IMPLANT
GOWN STRL REUS W/TWL LRG LVL3 (GOWN DISPOSABLE)
GOWN STRL REUS W/TWL XL LVL3 (GOWN DISPOSABLE) ×6
GRAFT BNE CHIP CANC 1-8 40 (Bone Implant) IMPLANT
KIT BASIN OR (CUSTOM PROCEDURE TRAY) ×2 IMPLANT
KIT POSITION SURG JACKSON T1 (MISCELLANEOUS) ×2 IMPLANT
KIT ROOM TURNOVER OR (KITS) ×2 IMPLANT
MILL MEDIUM DISP (BLADE) ×3 IMPLANT
MODULE NVM5 NEXT GEN EMG (NEEDLE) ×1 IMPLANT
NDL HYPO 25X1 1.5 SAFETY (NEEDLE) ×1 IMPLANT
NEEDLE HYPO 25X1 1.5 SAFETY (NEEDLE) ×2 IMPLANT
NEEDLE SPNL 18GX3.5 QUINCKE PK (NEEDLE) IMPLANT
NS IRRIG 1000ML POUR BTL (IV SOLUTION) ×2 IMPLANT
PACK LAMINECTOMY NEURO (CUSTOM PROCEDURE TRAY) ×2 IMPLANT
PAD ARMBOARD 7.5X6 YLW CONV (MISCELLANEOUS) ×6 IMPLANT
PATTIES SURGICAL .5 X.5 (GAUZE/BANDAGES/DRESSINGS) IMPLANT
PATTIES SURGICAL .5 X1 (DISPOSABLE) IMPLANT
PATTIES SURGICAL 1X1 (DISPOSABLE) IMPLANT
ROD 70MM (Rod) ×2 IMPLANT
ROD PLIF MAS 65MM (Rod) ×1 IMPLANT
ROD SPNL 70XPREBNT NS MAS (Rod) IMPLANT
SCREW LOCK (Screw) ×12 IMPLANT
SCREW LOCK FXNS SPNE MAS PL (Screw) IMPLANT
SCREW MAS PLIF POLY 5.0X40 3Z (Screw) ×4 IMPLANT
SCREW SHANKS 5.5X35 (Screw) ×2 IMPLANT
SCREW TULIP 5.5 (Screw) ×2 IMPLANT
SPONGE LAP 4X18 X RAY DECT (DISPOSABLE) IMPLANT
SPONGE SURGIFOAM ABS GEL 100 (HEMOSTASIS) ×2 IMPLANT
STAPLER SKIN PROX WIDE 3.9 (STAPLE) IMPLANT
STRIP CLOSURE SKIN 1/2X4 (GAUZE/BANDAGES/DRESSINGS) ×2 IMPLANT
SUT VIC AB 1 CT1 18XBRD ANBCTR (SUTURE) ×2 IMPLANT
SUT VIC AB 1 CT1 8-18 (SUTURE) ×4
SUT VIC AB 2-0 CT1 18 (SUTURE) ×4 IMPLANT
SUT VIC AB 3-0 SH 8-18 (SUTURE) ×4 IMPLANT
SYR 3ML LL SCALE MARK (SYRINGE) ×8 IMPLANT
SYR 5ML LL (SYRINGE) IMPLANT
TOWEL OR 17X24 6PK STRL BLUE (TOWEL DISPOSABLE) ×2 IMPLANT
TOWEL OR 17X26 10 PK STRL BLUE (TOWEL DISPOSABLE) ×2 IMPLANT
TRAP SPECIMEN MUCOUS 40CC (MISCELLANEOUS) ×2 IMPLANT
TRAY FOLEY W/METER SILVER 14FR (SET/KITS/TRAYS/PACK) ×2 IMPLANT
WATER STERILE IRR 1000ML POUR (IV SOLUTION) ×2 IMPLANT

## 2015-04-23 NOTE — Op Note (Signed)
04/23/2015  3:49 PM  PATIENT:  Javier Ewing  69 y.o. male  PRE-OPERATIVE DIAGNOSIS:  Spondylolisthesis, herniated lumbar disc, stenosis, DDD, spondylosis, radiculopathy L 45, L 5 S 1 levels  POST-OPERATIVE DIAGNOSIS: Spondylolisthesis, herniated lumbar disc, stenosis, DDD, spondylosis, radiculopathy L 45, L 5 S 1 levels  PROCEDURE:  Procedure(s): LUMBAR FOUR-FIVE,  LUMBAR FIVE-SACRAL ONE MAXIMUM ACCESS (MAS) POSTERIOR LUMBAR INTERBODY FUSION (PLIF)  (N/A)  PLIF with PEEK cages, autograft, allograft, pedicle screw fixation, posterolateral arthrodesis  Decompression greater than for standard PLIF procedure  SURGEON:  Surgeon(s) and Role:    * Hunt Zajicek, MD - Primary    * Jeffrey Jenkins, MD - Assisting  PHYSICIAN ASSISTANT:   ASSISTANTS: Poteat, RN   ANESTHESIA:   general  EBL:  Total I/O In: 2250 [I.V.:2000; IV Piggyback:250] Out: 1600 [Urine:1300; Blood:300]  BLOOD ADMINISTERED:none  DRAINS: none   LOCAL MEDICATIONS USED:  MARCAINE    and LIDOCAINE   SPECIMEN:  No Specimen  DISPOSITION OF SPECIMEN:  N/A  COUNTS:  YES  TOURNIQUET:  * No tourniquets in log *  DICTATION: Patient is a 69-year-old with spondylolisthesis , stenosis, disc herniation and severe back and left greater than right lower extremity pain at L4/5 and L 5 S 1 levels of the lumbar spine.  It was elected to take him to surgery for MASPLIF at  L 45 and L 5 S 1  levels with posterolateral arthrodesis.  Procedure:   Following uncomplicated induction of GETA, and placement of electrodes for neural monitoring, patient was turned into a prone position on the Jackson tableand using AP  fluoroscopy the area of planned incision was marked, prepped with betadine scrub and Duraprep, then draped. Exposure was performed of facet joint complex at L 45 and L 5 S 1 levels and the MAS retractor was placed.5.5 x 50 mm cortical Nuvasive screws were placed at L 4 bilaterally according to standard landmarks using neural  monitoring.  A total laminectomy of L 4 and L 5 was then performed with disarticulation of facets.  Decompression was greater than for standard PLIF procedure and thorough decompression of the thecal sac, bilateral L 4, L 5, S1 nerve roots was performed. Along with foraminal and extraforaminal portions of these nerve roots.  This bone was saved for grafting, combined with Osteocel and allograft chips after being run through bone mill and was placed in bone packing device.  Thorough discectomy was performed bilaterally at L 5 S 1  and the endplates were prepared for grafting.  23 x 8 x 8 degree cages were placed in the interspace and positioning was confirmed with AP and lateral fluoroscopy.  10 cc of autograft/Osteocel was packed in the interspace medial to the second cage.   Thorough discectomy was performed bilaterally at L 45  and the endplates were prepared for grafting.  There was a large lateral disc herniation on the left with severe L 4 and L 5 nerve root compression.  Using microdissection technique, the disc material was removed with significant decompression of all neural elements.  23 x 10 x 12 degree cages were placed in the interspace and positioning was confirmed with AP and lateral fluoroscopy.  10 cc of autograft/Osteocel was packed in the interspace medial to the second cage.   Remaining screws were placed at L 5 and and S 1 and 65 mm rods were placed. And the screws were locked and torqued.Final Xrays showed well positioned implants and screw fixation. The posterolateral region bilaterally was   packed with remaining 25 cc of autograft on each side. The wounds were irrigated and then closed with 1, 2-0 and 3-0 Vicryl stitches. 20 cc long-acting Marcaine was injected into the musculature.  Sterile occlusive dressing was placed with Dermabond and an occlusive dressing. The patient was then extubated in the operating room and taken to recovery in stable and satisfactory condition having tolerated her  operation well. Counts were correct at the end of the case.  PLAN OF CARE: Admit to inpatient   PATIENT DISPOSITION:  PACU - hemodynamically stable.   Delay start of Pharmacological VTE agent (>24hrs) due to surgical blood loss or risk of bleeding: yes

## 2015-04-23 NOTE — Brief Op Note (Signed)
04/23/2015  3:49 PM  PATIENT:  Javier Ewing  69 y.o. male  PRE-OPERATIVE DIAGNOSIS:  Spondylolisthesis, herniated lumbar disc, stenosis, DDD, spondylosis, radiculopathy L 45, L 5 S 1 levels  POST-OPERATIVE DIAGNOSIS: Spondylolisthesis, herniated lumbar disc, stenosis, DDD, spondylosis, radiculopathy L 45, L 5 S 1 levels  PROCEDURE:  Procedure(s): LUMBAR FOUR-FIVE,  LUMBAR FIVE-SACRAL ONE MAXIMUM ACCESS (MAS) POSTERIOR LUMBAR INTERBODY FUSION (PLIF)  (N/A)  PLIF with PEEK cages, autograft, allograft, pedicle screw fixation, posterolateral arthrodesis  Decompression greater than for standard PLIF procedure  SURGEON:  Surgeon(s) and Role:    * Maeola Harman, MD - Primary    * Tressie Stalker, MD - Assisting  PHYSICIAN ASSISTANT:   ASSISTANTS: Poteat, RN   ANESTHESIA:   general  EBL:  Total I/O In: 2250 [I.V.:2000; IV Piggyback:250] Out: 1600 [Urine:1300; Blood:300]  BLOOD ADMINISTERED:none  DRAINS: none   LOCAL MEDICATIONS USED:  MARCAINE    and LIDOCAINE   SPECIMEN:  No Specimen  DISPOSITION OF SPECIMEN:  N/A  COUNTS:  YES  TOURNIQUET:  * No tourniquets in log *  DICTATION: Patient is a 69 year old with spondylolisthesis , stenosis, disc herniation and severe back and left greater than right lower extremity pain at L4/5 and L 5 S 1 levels of the lumbar spine.  It was elected to take him to surgery for MASPLIF at  L 45 and L 5 S 1  levels with posterolateral arthrodesis.  Procedure:   Following uncomplicated induction of GETA, and placement of electrodes for neural monitoring, patient was turned into a prone position on the [place] tableand using AP  fluoroscopy the area of planned incision was marked, prepped with betadine scrub and Duraprep, then draped. Exposure was performed of facet joint complex at L 45 and L 5 S 1 levels and the MAS retractor was placed.5.5 x 50 mm cortical Nuvasive screws were placed at L 4 bilaterally according to standard landmarks using neural  monitoring.  A total laminectomy of L 4 and L 5 was then performed with disarticulation of facets.  Decompression was greater than for standard PLIF procedure and thorough decompression of the thecal sac, bilateral L 4, L 5, S1 nerve roots was performed. Along with foraminal and extraforaminal portions of these nerve roots.  This bone was saved for grafting, combined with Osteocel and allograft chips after being run through bone mill and was placed in bone packing device.  Thorough discectomy was performed bilaterally at L 5 S 1  and the endplates were prepared for grafting.  23 x 8 x 8 degree cages were placed in the interspace and positioning was confirmed with AP and lateral fluoroscopy.  10 cc of autograft/Osteocel was packed in the interspace medial to the second cage.   Thorough discectomy was performed bilaterally at L 45  and the endplates were prepared for grafting.  There was a large lateral disc herniation on the left with severe L 4 and L 5 nerve root compression.  Using microdissection technique, the disc material was removed with significant decompression of all neural elements.  23 x 10 x 12 degree cages were placed in the interspace and positioning was confirmed with AP and lateral fluoroscopy.  10 cc of autograft/Osteocel was packed in the interspace medial to the second cage.   Remaining screws were placed at L 5 and and S 1 and 65 mm rods were placed. And the screws were locked and torqued.Final Xrays showed well positioned implants and screw fixation. The posterolateral region bilaterally was  packed with remaining 25 cc of autograft on each side. The wounds were irrigated and then closed with 1, 2-0 and 3-0 Vicryl stitches. 20 cc long-acting Marcaine was injected into the musculature.  Sterile occlusive dressing was placed with Dermabond and an occlusive dressing. The patient was then extubated in the operating room and taken to recovery in stable and satisfactory condition having tolerated her  operation well. Counts were correct at the end of the case.  PLAN OF CARE: Admit to inpatient   PATIENT DISPOSITION:  PACU - hemodynamically stable.   Delay start of Pharmacological VTE agent (>24hrs) due to surgical blood loss or risk of bleeding: yes

## 2015-04-23 NOTE — Anesthesia Procedure Notes (Signed)
Procedure Name: Intubation Date/Time: 04/23/2015 11:50 AM Performed by: Wray KearnsFOLEY, Javier Ewing Pre-anesthesia Checklist: Patient identified, Timeout performed, Emergency Drugs available, Suction available and Patient being monitored Patient Re-evaluated:Patient Re-evaluated prior to inductionOxygen Delivery Method: Circle system utilized Preoxygenation: Pre-oxygenation with 100% oxygen Intubation Type: IV induction and Cricoid Pressure applied Ventilation: Mask ventilation without difficulty Laryngoscope Size: Mac and 4 Grade View: Grade I Tube type: Oral Tube size: 8.0 mm Number of attempts: 1 Airway Equipment and Method: Stylet Placement Confirmation: ETT inserted through vocal cords under direct vision,  breath sounds checked- equal and bilateral and positive ETCO2 Secured at: 23 cm Tube secured with: Tape Dental Injury: Teeth and Oropharynx as per pre-operative assessment

## 2015-04-23 NOTE — Transfer of Care (Signed)
Immediate Anesthesia Transfer of Care Note  Patient: Javier Ewing  Procedure(s) Performed: Procedure(s): LUMBAR FOUR-FIVE,  LUMBAR FIVE-SACRAL ONE MAXIMUM ACCESS (MAS) POSTERIOR LUMBAR INTERBODY FUSION (PLIF)  (N/A)  Patient Location: PACU  Anesthesia Type:General  Level of Consciousness: sedated and responds to stimulation  Airway & Oxygen Therapy: Patient Spontanous Breathing and Patient connected to nasal cannula oxygen  Post-op Assessment: Report given to RN and Post -op Vital signs reviewed and stable  Post vital signs: Reviewed and stable  Last Vitals:  Filed Vitals:   04/23/15 0924 04/23/15 1627  BP: 131/93 140/115  Pulse: 85 96  Temp: 36.6 C 36.5 C  Resp: 18 18    Complications: No apparent anesthesia complications

## 2015-04-23 NOTE — Progress Notes (Signed)
Care of pt assumed by MA Jermon Chalfant RN 

## 2015-04-23 NOTE — Anesthesia Preprocedure Evaluation (Signed)
Anesthesia Evaluation   Patient awake    Reviewed: Allergy & Precautions, NPO status , Patient's Chart, lab work & pertinent test results  History of Anesthesia Complications Negative for: history of anesthetic complications  Airway Mallampati: II  TM Distance: >3 FB Neck ROM: Full    Dental  (+) Teeth Intact   Pulmonary neg pulmonary ROS,    breath sounds clear to auscultation       Cardiovascular hypertension, Pt. on medications (-) angina(-) Past MI and (-) CHF  Rhythm:Regular     Neuro/Psych PSYCHIATRIC DISORDERS Anxiety negative neurological ROS     GI/Hepatic negative GI ROS, Neg liver ROS,   Endo/Other  negative endocrine ROS  Renal/GU negative Renal ROS     Musculoskeletal  (+) Arthritis ,   Abdominal   Peds  Hematology negative hematology ROS (+)   Anesthesia Other Findings   Reproductive/Obstetrics                             Anesthesia Physical Anesthesia Plan  ASA: II  Anesthesia Plan: General   Post-op Pain Management:    Induction: Intravenous  Airway Management Planned: Oral ETT  Additional Equipment: None  Intra-op Plan:   Post-operative Plan: Extubation in OR  Informed Consent: I have reviewed the patients History and Physical, chart, labs and discussed the procedure including the risks, benefits and alternatives for the proposed anesthesia with the patient or authorized representative who has indicated his/her understanding and acceptance.   Dental advisory given  Plan Discussed with: CRNA and Surgeon  Anesthesia Plan Comments:         Anesthesia Quick Evaluation

## 2015-04-23 NOTE — Interval H&P Note (Signed)
History and Physical Interval Note:  04/23/2015 7:10 AM  Javier Ewing  has presented today for surgery, with the diagnosis of Spondylolisthesis  The various methods of treatment have been discussed with the patient and family. After consideration of risks, benefits and other options for treatment, the patient has consented to  Procedure(s): L4-5, L5-S1 MAXIMUM ACCESS (MAS) POSTERIOR LUMBAR INTERBODY FUSION (PLIF)  (N/A) as a surgical intervention .  The patient's history has been reviewed, patient examined, no change in status, stable for surgery.  I have reviewed the patient's chart and labs.  Questions were answered to the patient's satisfaction.     Dianne Bady D

## 2015-04-23 NOTE — Progress Notes (Signed)
Sleepy, but arouses.  MAEW.  Doing well.

## 2015-04-23 NOTE — Anesthesia Postprocedure Evaluation (Signed)
Anesthesia Post Note  Patient: Javier Ewing  Procedure(s) Performed: Procedure(s) (LRB): LUMBAR FOUR-FIVE,  LUMBAR FIVE-SACRAL ONE MAXIMUM ACCESS (MAS) POSTERIOR LUMBAR INTERBODY FUSION (PLIF)  (N/A)  Patient location during evaluation: PACU Anesthesia Type: General Level of consciousness: awake and alert Pain management: pain level controlled Vital Signs Assessment: post-procedure vital signs reviewed and stable Respiratory status: spontaneous breathing, nonlabored ventilation, respiratory function stable and patient connected to nasal cannula oxygen Cardiovascular status: blood pressure returned to baseline and stable Postop Assessment: No signs of nausea or vomiting Anesthetic complications: no    Last Vitals:  Filed Vitals:   04/23/15 1755 04/23/15 1800  BP: 129/92   Pulse:  90  Temp:    Resp:  12    Last Pain:  Filed Vitals:   04/23/15 1800  PainSc: Asleep      LLE Sensation: Full sensation   RLE Sensation: Full sensation      Maud Rubendall A

## 2015-04-24 MED ORDER — TAMSULOSIN HCL 0.4 MG PO CAPS
0.4000 mg | ORAL_CAPSULE | Freq: Every day | ORAL | Status: DC
  Administered 2015-04-24: 0.4 mg via ORAL
  Filled 2015-04-24 (×2): qty 1

## 2015-04-24 MED ORDER — METHOCARBAMOL 500 MG PO TABS: 500.0000 mg | ORAL_TABLET | Freq: Four times a day (QID) | ORAL | Status: DC | PRN

## 2015-04-24 MED ORDER — PANTOPRAZOLE SODIUM 40 MG PO TBEC
40.0000 mg | DELAYED_RELEASE_TABLET | Freq: Every day | ORAL | Status: DC
  Administered 2015-04-24: 40 mg via ORAL
  Filled 2015-04-24: qty 1

## 2015-04-24 MED ORDER — OXYCODONE-ACETAMINOPHEN 5-325 MG PO TABS: 1.0000 | ORAL_TABLET | ORAL | Status: DC | PRN

## 2015-04-24 MED FILL — Sodium Chloride IV Soln 0.9%: INTRAVENOUS | Qty: 1000 | Status: AC

## 2015-04-24 MED FILL — Heparin Sodium (Porcine) Inj 1000 Unit/ML: INTRAMUSCULAR | Qty: 30 | Status: AC

## 2015-04-24 NOTE — Evaluation (Signed)
Occupational Therapy Evaluation Patient Details Name: Javier Ewing MRN: [id number] DOB: 18-Dec-1945 Today's Date: 04/24/2015    History of Present Illness 69 yo male s/p L4-5 L 5-s1 decompression/ fusion. PMH: HTN, depression, anxiety, ACDF C4-5    Clinical Impression   Patient evaluated by Occupational Therapy with no further acute OT needs identified. All education has been completed and the patient has no further questions. See below for any follow-up Occupational Therapy or equipment needs. OT to sign off. Thank you for referral.      Follow Up Recommendations  No OT follow up    Equipment Recommendations  None recommended by OT    Recommendations for Other Services       Precautions / Restrictions Precautions Precautions: Back Precaution Comments: back handout provided and pt able to recall 3 out 3 precautions at end of session  Required Braces or Orthoses: Spinal Brace Spinal Brace: Applied in sitting position;Lumbar corset      Mobility Bed Mobility Overal bed mobility: Modified Independent                Transfers Overall transfer level: Needs assistance   Transfers: Sit to/from Stand Sit to Stand: Supervision              Balance                                            ADL Overall ADL's : Needs assistance/impaired Eating/Feeding: Independent   Grooming: Oral care;Wash/dry face;Supervision/safety;Standing Grooming Details (indicate cue type and reason): educated on cup method for safety             Lower Body Dressing: Supervision/safety;Sit to/from stand Lower Body Dressing Details (indicate cue type and reason): able to cross bil LE  Toilet Transfer: Radiographer, therapeutic Details (indicate cue type and reason): static standing with urinal and less than 100 CC urine Toileting- Clothing Manipulation and Hygiene: Supervision/safety       Functional mobility during ADLs: Min guard (guarded posture  touching walls) General ADL Comments: Pt and wife educated on adls with back precautions     Vision     Perception     Praxis      Pertinent Vitals/Pain Pain Assessment: 0-10 Pain Score: 2  Pain Location: back surgery site Pain Descriptors / Indicators: Discomfort Pain Intervention(s): Repositioned;Premedicated before session;Monitored during session     Hand Dominance Left   Extremity/Trunk Assessment Upper Extremity Assessment Upper Extremity Assessment: Overall WFL for tasks assessed   Lower Extremity Assessment Lower Extremity Assessment: Defer to PT evaluation   Cervical / Trunk Assessment Cervical / Trunk Assessment: Other exceptions (past surg)   Communication Communication Communication: No difficulties   Cognition Arousal/Alertness: Awake/alert Behavior During Therapy: WFL for tasks assessed/performed Overall Cognitive Status: Within Functional Limits for tasks assessed                     General Comments       Exercises       Shoulder Instructions      Home Living Family/patient expects to be discharged to:: Private residence Living Arrangements: Spouse/significant other Available Help at Discharge: Family Type of Home: House Home Access: Level entry     Home Layout: One level     Bathroom Shower/Tub: Producer, television/film/video: Standard     Home Equipment: Environmental consultant - 2 wheels;Cane -  single point   Additional Comments: DME is from parents      Prior Functioning/Environment Level of Independence: Independent        Comments: Pt [place] 2 homes ( [place] and [place] [facility] [place] but they will stay at [place] initally    OT Diagnosis: Generalized weakness;Acute pain   OT Problem List:     OT Treatment/Interventions:      OT Goals(Current goals can be found in the care plan section)    OT Frequency:     Barriers to D/C:            Co-evaluation               End of Session Equipment Utilized During Treatment: Gait belt;Back brace Nurse Communication: Mobility status;Precautions  Activity Tolerance: Patient tolerated treatment well Patient left: in chair;with call bell/phone within reach;with family/visitor present   Time: 0734-0820 OT Time Calculation (min): 46 min Charges:  OT General Charges $OT Visit: 1 Procedure OT Evaluation $Initial OT Evaluation Tier I: 1 Procedure OT Treatments $Self Care/Home Management : 23-37 mins G-Codes:    Boone MasterJones, Laresha Bacorn B 04/24/2015, 8:34 AM   Mateo FlowJones, Brynn   OTR/L Pager: [phone]: [phone] Office: [phone] .

## 2015-04-24 NOTE — Evaluation (Signed)
Physical Therapy Evaluation and Discharge Patient Details Name: Javier Ewing MRN: [id number] DOB: September 18, 1945 Today's Date: 04/24/2015   History of Present Illness  68 yo male s/p L4-5 L 5-s1 decompression/ fusion. PMH: HTN, depression, anxiety, ACDF C4-5   Clinical Impression  Patient evaluated by Physical Therapy with no further acute PT needs identified. All education has been completed and the patient has no further questions. At the time of PT eval pt was able to perform transfers and ambulation with modified independence. Pt was able to recall all education provided by previously by OT, and therapist reinforced precautions, positioning, and an initial walking program. Pt would benefit from follow-up at neuro outpatient PT for return to baseline of function. See below for any follow-up Physical Therapy or equipment needs. PT is signing off. Thank you for this referral.     Follow Up Recommendations Outpatient PT (When appropriate per post-op protocol)    Equipment Recommendations  None recommended by PT    Recommendations for Other Services       Precautions / Restrictions Precautions Precautions: Back Precaution Comments: back handout provided and pt able to recall 3 out 3 precautions at end of session  Required Braces or Orthoses: Spinal Brace Spinal Brace: Applied in sitting position;Lumbar corset Restrictions Weight Bearing Restrictions: No      Mobility  Bed Mobility Overal bed mobility: Modified Independent             General bed mobility comments: Pt was received sitting up in recliner  Transfers Overall transfer level: Modified independent Equipment used: None Transfers: Sit to/from Stand Sit to Stand: Supervision         General transfer comment: No unsetadiness noted. No assist required.   Ambulation/Gait Ambulation/Gait assistance: Modified independent (Device/Increase time) Ambulation Distance (Feet): 400 Feet Assistive device: None Gait  Pattern/deviations: Step-through pattern;Decreased stride length;Shuffle Gait velocity: Decreased Gait velocity interpretation: Below normal speed for age/gender General Gait Details: Decreased heel strike noted with wide BOS and externally rotated hips. Pt generally steady and did not require any assistance throughout gait training. Positioning during gait likely from prior radicular symptoms.   Stairs            Wheelchair Mobility    Modified Rankin (Stroke Patients Only)       Balance Overall balance assessment: No apparent balance deficits (not formally assessed)                                           Pertinent Vitals/Pain Pain Assessment: 0-10 Pain Score: 2  Pain Location: Incision site Pain Descriptors / Indicators: Operative site guarding Pain Intervention(s): Limited activity within patient's tolerance;Monitored during session;Repositioned    Home Living Family/patient expects to be discharged to:: Private residence Living Arrangements: Spouse/significant other Available Help at Discharge: Family Type of Home: House Home Access: Level entry     Home Layout: One level Home Equipment: Environmental consultant - 2 wheels;Cane - single point Additional Comments: DME is from parents    Prior Function Level of Independence: Independent         Comments: Pt has 2 homes ( one in [place] and one in [facility] [place]) primary residence is at [place] but they will stay at [employer] home initally     Hand Dominance   Dominant Hand: Left    Extremity/Trunk Assessment   Upper Extremity Assessment: Defer to OT  evaluation           Lower Extremity Assessment: Generalized weakness      Cervical / Trunk Assessment: Other exceptions (past surg)  Communication   Communication: No difficulties  Cognition Arousal/Alertness: Awake/alert Behavior During Therapy: WFL for tasks assessed/performed Overall Cognitive Status: Within Functional Limits for  tasks assessed                      General Comments General comments (skin integrity, edema, etc.): dressing intact    Exercises        Assessment/Plan    PT Assessment Patent does not need any further PT services  PT Diagnosis Difficulty walking;Acute pain   PT Problem List    PT Treatment Interventions     PT Goals (Current goals can be found in the Care Plan section) Acute Rehab PT Goals PT Goal Formulation: All assessment and education complete, DC therapy    Frequency     Barriers to discharge        Co-evaluation               End of Session Equipment Utilized During Treatment: Back brace Activity Tolerance: Patient tolerated treatment well Patient left: in chair;with call bell/phone within reach;with family/visitor present Nurse Communication: Mobility status         Time: [id number] PT Time Calculation (min) (ACUTE ONLY): 15 min   Charges:   PT Evaluation $Initial PT Evaluation Tier I: 1 Procedure     PT G Codes:        Javier SlipperKirkman, Javier Ewing 04/24/2015, 8:57 AM

## 2015-04-24 NOTE — Progress Notes (Signed)
Subjective: Patient reports doing well  Objective: Vital signs in last 24 hours: Temp:  [97.5 F (36.4 C)-98 F (36.7 C)] 98 F (36.7 C) (11/23 0400) Pulse Rate:  [71-96] 71 (11/23 0400) Resp:  [10-22] 18 (11/23 0400) BP: (110-144)/(68-115) 128/83 mmHg (11/23 0400) SpO2:  [94 %-100 %] 100 % (11/23 0400) Weight:  [76.476 kg (168 lb 9.6 oz)] 76.476 kg (168 lb 9.6 oz) (11/22 0924)  Intake/Output from previous day: 11/22 0701 - 11/23 0700 In: 3260 [P.O.:30; I.V.:2980; IV Piggyback:250] Out: 3250 [Urine:2950; Blood:300] Intake/Output this shift:    Physical Exam: Full strength, no numbness.  Pain 2/10 compared to preop (10/10).  Dressing CDI.  Lab Results: No results for input(s): WBC, HGB, HCT, PLT in the last 72 hours. BMET No results for input(s): NA, K, CL, CO2, GLUCOSE, BUN, CREATININE, CALCIUM in the last 72 hours.  Studies/Results: Dg Lumbar Spine 2-3 Views  04/23/2015  CLINICAL DATA:  Status post surgical posterior fusion of L4 through S1. EXAM: DG C-ARM 61-120 MIN; LUMBAR SPINE - 2-3 VIEW COMPARISON:  April 01, 2015. FLUOROSCOPY TIME:  55 seconds. FINDINGS: Two intraoperative fluoroscopic images of the lower lumbar spine were obtained. These demonstrate the patient be status post surgical posterior fusion of L4, L5 and S1 with bilateral intrapedicular screw placement and interbody fusion. Minimal grade 1 anterolisthesis of L4-5 remains. IMPRESSION: Status post surgical posterior fusion of lower lumbar spine. Electronically Signed   By: Lupita RaiderJames  Green Jr, M.D.   On: 04/23/2015 15:39   Dg C-arm 1-60 Min  04/23/2015  CLINICAL DATA:  Status post surgical posterior fusion of L4 through S1. EXAM: DG C-ARM 61-120 MIN; LUMBAR SPINE - 2-3 VIEW COMPARISON:  April 01, 2015. FLUOROSCOPY TIME:  55 seconds. FINDINGS: Two intraoperative fluoroscopic images of the lower lumbar spine were obtained. These demonstrate the patient be status post surgical posterior fusion of L4, L5 and S1 with  bilateral intrapedicular screw placement and interbody fusion. Minimal grade 1 anterolisthesis of L4-5 remains. IMPRESSION: Status post surgical posterior fusion of lower lumbar spine. Electronically Signed   By: Lupita RaiderJames  Green Jr, M.D.   On: 04/23/2015 15:39    Assessment/Plan: Doing well.  Mobilize today, likely D/C in am.     LOS: 1 day    Dorian HeckleSTERN,Neelam Tiggs D, MD 04/24/2015, 7:59 AM

## 2015-04-24 NOTE — Care Management (Signed)
Utilization review completed. Quade Ramirez, RN Case Manager [phone]. 

## 2015-04-24 NOTE — Discharge Summary (Signed)
Physician Discharge Summary  Patient ID: Javier Ewing MRN: [id number] DOB/AGE: [birth year] 69 y.o.  Admit date: 04/23/2015 Discharge date: 04/24/2015  Admission Diagnoses:Herniated lumbar disc, spondylolisthesis, stenosis, radiculopathy L 45, L 5 S 1 levels  Discharge Diagnoses: Herniated lumbar disc, spondylolisthesis, stenosis, radiculopathy L 45, L 5 S 1 levels Active Problems:   Spondylolisthesis of lumbar region   Discharged Condition: good  Hospital Course: Patient underwent decompression and fusion L 45 and L 5 S 1 levels with good relief of radicular pain.  Patient mobilized without difficulty and was D/C ed home on POD 2.  Consults: None  Significant Diagnostic Studies: None  Treatments: surgery: decompression and fusion L 45 and L 5 S 1 levels  Discharge Exam: Blood pressure 128/83, pulse 71, temperature 98 F (36.7 C), temperature source Oral, resp. rate 18, height 5\' 10"  (1.778 m), weight 76.476 kg (168 lb 9.6 oz), SpO2 100 %. Neurologic: Alert and oriented X 3, normal strength and tone. Normal symmetric reflexes. Normal coordination and gait Wound:CDI  Disposition: Home     Medication List    TAKE these medications        ALIGN 4 MG Caps  Take 1 capsule by mouth once a week.     aspirin EC 81 MG tablet  Take 81 mg by mouth daily.     cholecalciferol 1000 UNITS tablet  Commonly known as:  VITAMIN D  Take 1,000 Units by mouth daily.     citalopram 10 MG tablet  Commonly known as:  CELEXA  Take 10 mg by mouth daily.     fish oil-omega-3 fatty acids 1000 MG capsule  Take 1 g by mouth daily.     Fish Oil 1200 MG Caps  Take 1,200 mg by mouth daily.     losartan-hydrochlorothiazide 100-25 MG tablet  Commonly known as:  HYZAAR  Take 1 tablet by mouth daily.     losartan-hydrochlorothiazide 100-12.5 MG tablet  Commonly known as:  HYZAAR  Take 1 tablet by mouth daily.     methocarbamol 500 MG tablet  Commonly known as:  ROBAXIN  Take 1 tablet  (500 mg total) by mouth every 6 (six) hours as needed for muscle spasms.     multivitamin with minerals Tabs tablet  Take 1 tablet by mouth daily.     ondansetron 4 MG tablet  Commonly known as:  ZOFRAN  Take 1 tablet (4 mg total) by mouth every 6 (six) hours.     oxyCODONE-acetaminophen 5-325 MG tablet  Commonly known as:  PERCOCET/ROXICET  Take 1-2 tablets by mouth every 6 (six) hours as needed for pain.     oxyCODONE-acetaminophen 5-325 MG tablet  Commonly known as:  PERCOCET/ROXICET  Take 1-2 tablets by mouth every 4 (four) hours as needed for moderate pain.     tamsulosin 0.4 MG Caps capsule  Commonly known as:  FLOMAX  Take 1 capsule (0.4 mg total) by mouth once.         Signed: Dorian HeckleSTERN,Seena Ritacco D, MD 04/24/2015, 8:02 AM

## 2015-04-25 NOTE — Progress Notes (Signed)
Pt and wife given D/C instructions with Rx's, verbal understanding was provided. Pt's incision is clean and dry with no sign of infection. Pt's IV was removed prior to D/C. Pt D/C'd home via wheelchair @ 0915 per MD order. Pt is stable @ D/C and has no other needs at this time. Rema FendtAshley Gisela Lea, RN

## 2015-04-25 NOTE — Discharge Summary (Signed)
Physician Discharge Summary  Patient ID: Javier Ewing MRN: [id number] DOB/AGE: Jul 11, 1945 69 y.o.  Admit date: 04/23/2015 Discharge date: 04/25/2015  Admission Diagnoses: SpondyloListhesis    Discharge Diagnoses: Same   Discharged Condition: good  Hospital Course: The patient was admitted on 04/23/2015 and taken to the operating room where the patient underwent lumbar fusion. The patient tolerated the procedure well and was taken to the recovery room and then to the floor in stable condition. The hospital course was routine. There were no complications. The wound remained clean dry and intact. Pt had appropriate back soreness. No complaints of leg pain or new N/T/W. The patient remained afebrile with stable vital signs, and tolerated a regular diet. The patient continued to increase activities, and pain was well controlled with oral pain medications.   Consults: None  Significant Diagnostic Studies:  Results for orders placed or performed during the hospital encounter of 04/19/15  Surgical pcr screen  Result Value Ref Range   MRSA, PCR NEGATIVE NEGATIVE   Staphylococcus aureus POSITIVE (A) NEGATIVE  CBC  Result Value Ref Range   WBC 8.7 4.0 - 10.5 K/uL   RBC 4.26 4.22 - 5.81 MIL/uL   Hemoglobin 14.3 13.0 - 17.0 g/dL   HCT 40.940.9 81.139.0 - 91.452.0 %   MCV 96.0 78.0 - 100.0 fL   MCH 33.6 26.0 - 34.0 pg   MCHC 35.0 30.0 - 36.0 g/dL   RDW 78.213.4 95.611.5 - 21.315.5 %   Platelets 211 150 - 400 K/uL  Basic metabolic panel  Result Value Ref Range   Sodium 137 135 - 145 mmol/L   Potassium 3.8 3.5 - 5.1 mmol/L   Chloride 104 101 - 111 mmol/L   CO2 24 22 - 32 mmol/L   Glucose, Bld 86 65 - 99 mg/dL   BUN 24 (H) 6 - 20 mg/dL   Creatinine, Ser 0.861.05 0.61 - 1.24 mg/dL   Calcium 9.8 8.9 - 57.810.3 mg/dL   GFR calc non Af Amer >60 >60 mL/min   GFR calc Af Amer >60 >60 mL/min   Anion gap 9 5 - 15  Type and screen [facility]  Result Value Ref Range   ABO/RH(D) O NEG    Antibody Screen  NEG    Sample Expiration 05/03/2015    Extend sample reason NO TRANSFUSIONS OR PREGNANCY IN THE PAST 3 MONTHS   ABO/Rh  Result Value Ref Range   ABO/RH(D) O NEG     Dg Lumbar Spine 2-3 Views  04/23/2015  CLINICAL DATA:  Status post surgical posterior fusion of L4 through S1. EXAM: DG C-ARM 61-120 MIN; LUMBAR SPINE - 2-3 VIEW COMPARISON:  April 01, 2015. FLUOROSCOPY TIME:  55 seconds. FINDINGS: Two intraoperative fluoroscopic images of the lower lumbar spine were obtained. These demonstrate the patient be status post surgical posterior fusion of L4, L5 and S1 with bilateral intrapedicular screw placement and interbody fusion. Minimal grade 1 anterolisthesis of L4-5 remains. IMPRESSION: Status post surgical posterior fusion of lower lumbar spine. Electronically Signed   By: Lupita RaiderJames  Green Jr, M.D.   On: 04/23/2015 15:39   Dg C-arm 1-60 Min  04/23/2015  CLINICAL DATA:  Status post surgical posterior fusion of L4 through S1. EXAM: DG C-ARM 61-120 MIN; LUMBAR SPINE - 2-3 VIEW COMPARISON:  April 01, 2015. FLUOROSCOPY TIME:  55 seconds. FINDINGS: Two intraoperative fluoroscopic images of the lower lumbar spine were obtained. These demonstrate the patient be status post surgical posterior fusion of L4, L5 and S1 with  bilateral intrapedicular screw placement and interbody fusion. Minimal grade 1 anterolisthesis of L4-5 remains. IMPRESSION: Status post surgical posterior fusion of lower lumbar spine. Electronically Signed   By: Lupita Raider, M.D.   On: 04/23/2015 15:39    Antibiotics:  Anti-infectives    Start     Dose/Rate Route Frequency Ordered Stop   04/23/15 2200  ceFAZolin (ANCEF) IVPB 1 g/50 mL premix     1 g 100 mL/hr over 30 Minutes Intravenous Every 8 hours 04/23/15 1854 04/24/15 0535   04/23/15 0900  ceFAZolin (ANCEF) IVPB 2 g/50 mL premix     2 g 100 mL/hr over 30 Minutes Intravenous To ShortStay Surgical 04/22/15 1110 04/23/15 1600      Discharge Exam: Blood pressure 111/69,  pulse 63, temperature 97.6 F (36.4 C), temperature source Oral, resp. rate 16, height  (1.778 m), weight 76.476 kg (168 lb 9.6 oz), SpO2 95 %. Neurologic: Grossly normal Dressing dry  Discharge Medications:     Medication List    TAKE these medications        ALIGN 4 MG Caps  Take 1 capsule by mouth once a week.     aspirin EC 81 MG tablet  Take 81 mg by mouth daily.     cholecalciferol 1000 UNITS tablet  Commonly known as:  VITAMIN D  Take 1,000 Units by mouth daily.     citalopram 10 MG tablet  Commonly known as:  CELEXA  Take 10 mg by mouth daily.     fish oil-omega-3 fatty acids 1000 MG capsule  Take 1 g by mouth daily.     Fish Oil 1200 MG Caps  Take 1,200 mg by mouth daily.     losartan-hydrochlorothiazide 100-25 MG tablet  Commonly known as:  HYZAAR  Take 1 tablet by mouth daily.     losartan-hydrochlorothiazide 100-12.5 MG tablet  Commonly known as:  HYZAAR  Take 1 tablet by mouth daily.     methocarbamol 500 MG tablet  Commonly known as:  ROBAXIN  Take 1 tablet (500 mg total) by mouth every 6 (six) hours as needed for muscle spasms.     multivitamin with minerals Tabs tablet  Take 1 tablet by mouth daily.     ondansetron 4 MG tablet  Commonly known as:  ZOFRAN  Take 1 tablet (4 mg total) by mouth every 6 (six) hours.     oxyCODONE-acetaminophen 5-325 MG tablet  Commonly known as:  PERCOCET/ROXICET  Take 1-2 tablets by mouth every 6 (six) hours as needed for pain.     oxyCODONE-acetaminophen 5-325 MG tablet  Commonly known as:  PERCOCET/ROXICET  Take 1-2 tablets by mouth every 4 (four) hours as needed for moderate pain.     tamsulosin 0.4 MG Caps capsule  Commonly known as:  FLOMAX  Take 1 capsule (0.4 mg total) by mouth once.        Disposition: Home   Final Dx: Lumbar fusion      Discharge Instructions    Call MD for:  difficulty breathing, headache or visual disturbances    Complete by:  As directed      Call MD for:   persistant nausea and vomiting    Complete by:  As directed      Call MD for:  redness, tenderness, or signs of infection (pain, swelling, redness, odor or green/yellow discharge around incision site)    Complete by:  As directed      Call MD for:  severe uncontrolled  pain    Complete by:  As directed      Call MD for:  temperature >100.4    Complete by:  As directed      Diet - low sodium heart healthy    Complete by:  As directed      Increase activity slowly    Complete by:  As directed            Follow-up Information    Follow up with Dorian Heckle, MD.   Specialty:  Neurosurgery   Contact information:   [address] [place] [address] [phone]        Signed: Tia Alert 04/25/2015, 7:49 AM

## 2015-04-25 NOTE — Care Management Note (Signed)
Case Management Note  Patient Details  Name: Javier Ewing MRN: [id number] Date of Birth: 04/03/1946  Subjective/Objective:                  Patient underwent decompression and fusion L 45 and L 5 S 1  Action/Plan: CM spoke to patient at the bedside regarding discharge plan. Patient said that their understanding was to follow up with surgeon in 3 weeks and then will recommend PT at that point. CM spoke to Nurse, Morrie SheldonAshley who said the same thing. Patient said that he is walking independently but if he needs equipment he has a walker and cane at home. Member lives in 1 story home with walk in shower with seat. No difficulty obtaining medications. Cm remains available should additional needs arise.   Expected Discharge Date:  04/25/15               Expected Discharge Plan:  Home/Self Care  In-House Referral:     Discharge planning Services  CM Consult  Post Acute Care Choice:    Choice offered to:     DME Arranged:    DME Agency:     HH Arranged:    HH Agency:     Status of Service:  Completed, signed off  Medicare Important Message Given:    Date Medicare IM Given:    Medicare IM give by:    Date Additional Medicare IM Given:    Additional Medicare Important Message give by:     If discussed at Long Length of Stay Meetings, dates discussed:    Additional Comments:  Darcel SmallingAnna C Grason [place], RN 04/25/2015, 8:34 AM

## 2015-04-25 NOTE — Discharge Instructions (Signed)
Wound Care °Leave incision open to air. °You may shower. °Do not scrub directly on incision.  °Do not put any creams, lotions, or ointments on incision. °Activity °Walk each and every day, increasing distance each day. °No lifting greater than 5 lbs.  Avoid bending, arching, and twisting. °No driving for 2 weeks; may ride as a passenger locally. °If provided with back brace, wear when out of bed.  It is not necessary to wear in bed. °Diet °Resume your normal diet.  °Return to Work °Will be discussed at you follow up appointment. °Call Your Doctor If Any of These Occur °Redness, drainage, or swelling at the wound.  °Temperature greater than 101 degrees. °Severe pain not relieved by pain medication. °Incision starts to come apart. °Follow Up Appt °Call today for appointment in 3-4 weeks ([phone]) or for problems.  If you have any hardware placed in your spine, you will need an x-ray before your appointment. ° ° °Spinal Fusion °Spinal fusion is a procedure to make 2 or more of the bones in your spinal column (vertebrae) grow together (fuse). This procedure stops movement between the vertebrae and can relieve pain and prevent deformity.  °Spinal fusion is used to treat the following conditions: °· Fractures of the spine. °· Herniated disk (the spongy material [cartilage] between the vertebrae). °· Abnormal curvatures of the spine, such as scoliosis or kyphosis. °· A weak or an unstable spine, caused by infections or tumor. °RISKS AND COMPLICATIONS °Complications associated with spinal fusion are rare, but they can occur. Possible complications include: °· Bleeding. °· Infection near the incision. °· Nerve damage. Signs of nerve damage are back pain, pain in one or both legs, weakness, or numbness. °· Spinal fluid leakage. °· Blood clot in your leg, which can move to your lungs. °· Difficulty controlling urination or bowel movements. °BEFORE THE PROCEDURE °· A medical evaluation will be done. This will include a  physical exam, blood tests, and imaging exams. °· You will talk with an anesthesiologist. This is the person who will be in charge of the anesthesia during the procedure. Spinal fusion usually requires that you are asleep during the procedure (general anesthesia). °· You will need to stop taking certain medicines, particularly those associated with an increased risk of bleeding. Ask your caregiver about changing or stopping your regular medicines. °· If you smoke, you will need to stop at least 2 weeks before the procedure. Smoking can slow down the healing process, especially fusion of the vertebrae, and increase the risk of complications. °· Do not eat or drink anything for at least 8 hours before the procedure. °PROCEDURE  °A cut (incision) is made over the vertebrae that will be fused. The back muscles are separated from the vertebrae. If you are having this procedure to treat a herniated disk, the disc material pressing on the nerve root is removed (decompression). The area where the disk is removed is then filled with extra bone. Bone from another part of your body (autogenous bone) or bone from a bone donor (allograft bone) may be used. The extra bone promotes fusion between the vertebrae. Sometimes, specific medicines are added to the fusion area to promote bone healing. In most cases, screws and rods or metal plates will be used to attach the vertebrae to stabilize them while they fuse.  °AFTER THE PROCEDURE  °· You will stay in a recovery area until the anesthesia has worn off. Your blood pressure and pulse will be checked frequently. °· You will be   given antibiotics to prevent infection. °· You may continue to receive fluids through an intravenous (IV) tube while you are still in the hospital. °· Pain after surgery is normal. You will be given pain medicine. °· You will be taught how to move correctly and how to stand and walk. While in bed, you will be instructed to turn frequently, using a "log rolling"  technique, in which the entire body is moved without twisting the back. °  °This information is not intended to replace advice given to you by your health care provider. Make sure you discuss any questions you have with your health care provider. °  °Document Released: 02/14/2003 Document Revised: 08/10/2011 Document Reviewed: 10/31/2014 °Elsevier Interactive Patient Education ©2016 [employer] ° °

## 2015-04-29 ENCOUNTER — Encounter (HOSPITAL_COMMUNITY): Payer: Self-pay | Admitting: Neurosurgery

## 2015-07-24 DIAGNOSIS — H9193 Unspecified hearing loss, bilateral: Secondary | ICD-10-CM | POA: Diagnosis not present

## 2015-07-24 DIAGNOSIS — J342 Deviated nasal septum: Secondary | ICD-10-CM | POA: Diagnosis not present

## 2015-07-24 DIAGNOSIS — B369 Superficial mycosis, unspecified: Secondary | ICD-10-CM | POA: Diagnosis not present

## 2015-07-24 DIAGNOSIS — J310 Chronic rhinitis: Secondary | ICD-10-CM | POA: Diagnosis not present

## 2015-07-24 DIAGNOSIS — H6123 Impacted cerumen, bilateral: Secondary | ICD-10-CM | POA: Diagnosis not present

## 2015-07-24 DIAGNOSIS — H6243 Otitis externa in other diseases classified elsewhere, bilateral: Secondary | ICD-10-CM | POA: Diagnosis not present

## 2015-07-29 DIAGNOSIS — Z6824 Body mass index (BMI) 24.0-24.9, adult: Secondary | ICD-10-CM | POA: Diagnosis not present

## 2015-07-29 DIAGNOSIS — M4806 Spinal stenosis, lumbar region: Secondary | ICD-10-CM | POA: Diagnosis not present

## 2015-07-29 DIAGNOSIS — M545 Low back pain: Secondary | ICD-10-CM | POA: Diagnosis not present

## 2015-07-29 DIAGNOSIS — M5416 Radiculopathy, lumbar region: Secondary | ICD-10-CM | POA: Diagnosis not present

## 2015-07-29 DIAGNOSIS — M4316 Spondylolisthesis, lumbar region: Secondary | ICD-10-CM | POA: Diagnosis not present

## 2015-09-23 DIAGNOSIS — E784 Other hyperlipidemia: Secondary | ICD-10-CM | POA: Diagnosis not present

## 2015-09-23 DIAGNOSIS — I10 Essential (primary) hypertension: Secondary | ICD-10-CM | POA: Diagnosis not present

## 2015-09-23 DIAGNOSIS — Z125 Encounter for screening for malignant neoplasm of prostate: Secondary | ICD-10-CM | POA: Diagnosis not present

## 2015-09-24 DIAGNOSIS — H6123 Impacted cerumen, bilateral: Secondary | ICD-10-CM | POA: Diagnosis not present

## 2015-09-24 DIAGNOSIS — H903 Sensorineural hearing loss, bilateral: Secondary | ICD-10-CM | POA: Diagnosis not present

## 2015-09-25 DIAGNOSIS — Z1389 Encounter for screening for other disorder: Secondary | ICD-10-CM | POA: Diagnosis not present

## 2015-09-25 DIAGNOSIS — R002 Palpitations: Secondary | ICD-10-CM | POA: Diagnosis not present

## 2015-09-25 DIAGNOSIS — Z125 Encounter for screening for malignant neoplasm of prostate: Secondary | ICD-10-CM | POA: Diagnosis not present

## 2015-09-25 DIAGNOSIS — Z0000 Encounter for general adult medical examination without abnormal findings: Secondary | ICD-10-CM | POA: Diagnosis not present

## 2015-09-25 DIAGNOSIS — M1990 Unspecified osteoarthritis, unspecified site: Secondary | ICD-10-CM | POA: Diagnosis not present

## 2015-09-25 DIAGNOSIS — E784 Other hyperlipidemia: Secondary | ICD-10-CM | POA: Diagnosis not present

## 2015-09-25 DIAGNOSIS — Z87448 Personal history of other diseases of urinary system: Secondary | ICD-10-CM | POA: Diagnosis not present

## 2015-09-25 DIAGNOSIS — Z6824 Body mass index (BMI) 24.0-24.9, adult: Secondary | ICD-10-CM | POA: Diagnosis not present

## 2015-09-25 DIAGNOSIS — F419 Anxiety disorder, unspecified: Secondary | ICD-10-CM | POA: Diagnosis not present

## 2015-09-25 DIAGNOSIS — I10 Essential (primary) hypertension: Secondary | ICD-10-CM | POA: Diagnosis not present

## 2015-09-27 DIAGNOSIS — Z1212 Encounter for screening for malignant neoplasm of rectum: Secondary | ICD-10-CM | POA: Diagnosis not present

## 2015-10-02 DIAGNOSIS — J029 Acute pharyngitis, unspecified: Secondary | ICD-10-CM | POA: Diagnosis not present

## 2015-10-02 DIAGNOSIS — J069 Acute upper respiratory infection, unspecified: Secondary | ICD-10-CM | POA: Diagnosis not present

## 2015-10-02 DIAGNOSIS — R05 Cough: Secondary | ICD-10-CM | POA: Diagnosis not present

## 2016-01-06 DIAGNOSIS — Z800 Family history of malignant neoplasm of digestive organs: Secondary | ICD-10-CM | POA: Diagnosis not present

## 2016-01-06 DIAGNOSIS — K580 Irritable bowel syndrome with diarrhea: Secondary | ICD-10-CM | POA: Diagnosis not present

## 2016-01-06 DIAGNOSIS — K591 Functional diarrhea: Secondary | ICD-10-CM | POA: Diagnosis not present

## 2016-02-10 DIAGNOSIS — H5213 Myopia, bilateral: Secondary | ICD-10-CM | POA: Diagnosis not present

## 2016-02-10 DIAGNOSIS — K580 Irritable bowel syndrome with diarrhea: Secondary | ICD-10-CM | POA: Diagnosis not present

## 2016-03-11 DIAGNOSIS — Z23 Encounter for immunization: Secondary | ICD-10-CM | POA: Diagnosis not present

## 2016-07-29 DIAGNOSIS — M12812 Other specific arthropathies, not elsewhere classified, left shoulder: Secondary | ICD-10-CM | POA: Diagnosis not present

## 2016-09-17 DIAGNOSIS — I10 Essential (primary) hypertension: Secondary | ICD-10-CM | POA: Diagnosis not present

## 2016-09-17 DIAGNOSIS — E784 Other hyperlipidemia: Secondary | ICD-10-CM | POA: Diagnosis not present

## 2016-09-17 DIAGNOSIS — Z125 Encounter for screening for malignant neoplasm of prostate: Secondary | ICD-10-CM | POA: Diagnosis not present

## 2016-09-28 DIAGNOSIS — Z0000 Encounter for general adult medical examination without abnormal findings: Secondary | ICD-10-CM | POA: Diagnosis not present

## 2016-09-28 DIAGNOSIS — R209 Unspecified disturbances of skin sensation: Secondary | ICD-10-CM | POA: Diagnosis not present

## 2016-09-28 DIAGNOSIS — R002 Palpitations: Secondary | ICD-10-CM | POA: Diagnosis not present

## 2016-09-28 DIAGNOSIS — I10 Essential (primary) hypertension: Secondary | ICD-10-CM | POA: Diagnosis not present

## 2016-10-02 DIAGNOSIS — Z1212 Encounter for screening for malignant neoplasm of rectum: Secondary | ICD-10-CM | POA: Diagnosis not present

## 2016-10-19 DIAGNOSIS — K635 Polyp of colon: Secondary | ICD-10-CM | POA: Diagnosis not present

## 2016-10-19 DIAGNOSIS — Z800 Family history of malignant neoplasm of digestive organs: Secondary | ICD-10-CM | POA: Diagnosis not present

## 2016-10-19 DIAGNOSIS — Z1211 Encounter for screening for malignant neoplasm of colon: Secondary | ICD-10-CM | POA: Diagnosis not present

## 2016-10-22 DIAGNOSIS — K635 Polyp of colon: Secondary | ICD-10-CM | POA: Diagnosis not present

## 2016-12-09 DIAGNOSIS — N3281 Overactive bladder: Secondary | ICD-10-CM | POA: Diagnosis not present

## 2016-12-09 DIAGNOSIS — R351 Nocturia: Secondary | ICD-10-CM | POA: Diagnosis not present

## 2017-02-05 DIAGNOSIS — K4090 Unilateral inguinal hernia, without obstruction or gangrene, not specified as recurrent: Secondary | ICD-10-CM | POA: Diagnosis not present

## 2017-02-05 DIAGNOSIS — Z6824 Body mass index (BMI) 24.0-24.9, adult: Secondary | ICD-10-CM | POA: Diagnosis not present

## 2017-03-04 ENCOUNTER — Ambulatory Visit: Payer: Self-pay | Admitting: General Surgery

## 2017-03-04 DIAGNOSIS — K4090 Unilateral inguinal hernia, without obstruction or gangrene, not specified as recurrent: Secondary | ICD-10-CM | POA: Diagnosis not present

## 2017-03-04 NOTE — Patient Instructions (Signed)
Javier Ewing  03/04/2017   Your procedure is scheduled on: 03/09/2017    Report to [facility] Main  Entrance Take [place]  elevators to 3rd floor to  Short Stay Center at   0530 AM.    Call this number if you have problems the morning of surgery [phone]    Remember: ONLY 1 PERSON MAY GO WITH YOU TO SHORT STAY TO GET  READY MORNING OF YOUR SURGERY.  Do not eat food or drink liquids :After Midnight.     Take these medicines the morning of surgery with A SIP OF WATER: none                                You may not have any metal on your body including hair pins and              piercings  Do not wear jewelry, , lotions, powders or perfumes, deodorant               Men may shave face and neck.   Do not bring valuables to the hospital. [facility] IS NOT             RESPONSIBLE   FOR VALUABLES.  Contacts, dentures or bridgework may not be worn into surgery.  Leave suitcase in the car. After surgery it may be brought to your room.                     Please read over the following fact sheets you were given: _____________________________________________________________________             [facility] - Preparing for Surgery Before surgery, you can play an important role.  Because skin is not sterile, your skin needs to be as free of germs as possible.  You can reduce the number of germs on your skin by washing with CHG (chlorahexidine gluconate) soap before surgery.  CHG is an antiseptic cleaner which kills germs and bonds with the skin to continue killing germs even after washing. Please DO NOT use if you have an allergy to CHG or antibacterial soaps.  If your skin becomes reddened/irritated stop using the CHG and inform your nurse when you arrive at Short Stay. Do not shave (including legs and underarms) for at least 48 hours prior to the first CHG shower.  You may shave your face/neck. Please follow these instructions carefully:  1.  Shower with  CHG Soap the night before surgery and the  morning of Surgery.  2.  If you choose to wash your hair, wash your hair first as usual with your  normal  shampoo.  3.  After you shampoo, rinse your hair and body thoroughly to remove the  shampoo.                           4.  Use CHG as you would any other liquid soap.  You can apply chg directly  to the skin and wash                       Gently with a scrungie or clean washcloth.  5.  Apply the CHG Soap to your body ONLY FROM THE NECK DOWN.   Do not use on face/ open  Wound or open sores. Avoid contact with eyes, ears mouth and genitals (private parts).                       Wash face,  Genitals (private parts) with your normal soap.             6.  Wash thoroughly, paying special attention to the area where your surgery  will be performed.  7.  Thoroughly rinse your body with warm water from the neck down.  8.  DO NOT shower/wash with your normal soap after using and rinsing off  the CHG Soap.                9.  Pat yourself dry with a clean towel.            10.  Wear clean pajamas.            11.  Place clean sheets on your bed the night of your first shower and do not  sleep with pets. Day of Surgery : Do not apply any lotions/deodorants the morning of surgery.  Please wear clean clothes to the hospital/surgery center.  FAILURE TO FOLLOW THESE INSTRUCTIONS MAY RESULT IN THE CANCELLATION OF YOUR SURGERY PATIENT SIGNATURE_________________________________  NURSE SIGNATURE__________________________________  ________________________________________________________________________

## 2017-03-05 ENCOUNTER — Encounter (HOSPITAL_COMMUNITY): Payer: Self-pay

## 2017-03-05 ENCOUNTER — Encounter (HOSPITAL_COMMUNITY)
Admission: RE | Admit: 2017-03-05 | Discharge: 2017-03-05 | Disposition: A | Discharge status: Home / Self Care | Payer: Medicare Other | Source: Ambulatory Visit | Attending: General Surgery | Admitting: General Surgery

## 2017-03-05 ENCOUNTER — Encounter (INDEPENDENT_AMBULATORY_CARE_PROVIDER_SITE_OTHER): Payer: Self-pay

## 2017-03-05 DIAGNOSIS — Z01812 Encounter for preprocedural laboratory examination: Secondary | ICD-10-CM | POA: Insufficient documentation

## 2017-03-05 DIAGNOSIS — K4090 Unilateral inguinal hernia, without obstruction or gangrene, not specified as recurrent: Secondary | ICD-10-CM | POA: Insufficient documentation

## 2017-03-05 HISTORY — DX: Personal history of urinary calculi: Z87.442

## 2017-03-05 LAB — BASIC METABOLIC PANEL
ANION GAP: 10 (ref 5–15)
BUN: 22 mg/dL — ABNORMAL HIGH (ref 6–20)
CHLORIDE: 104 mmol/L (ref 101–111)
CO2: 24 mmol/L (ref 22–32)
CREATININE: 0.77 mg/dL (ref 0.61–1.24)
Calcium: 9.9 mg/dL (ref 8.9–10.3)
GFR calc non Af Amer: >60 mL/min (ref >60)
Glucose, Bld: 92 mg/dL (ref 65–99)
Potassium: 3.9 mmol/L (ref 3.5–5.1)
SODIUM: 138 mmol/L (ref 135–145)

## 2017-03-05 LAB — CBC
HCT: 41 % (ref 39.0–52.0)
Hemoglobin: 14.5 g/dL (ref 13.0–17.0)
MCH: 33.5 pg (ref 26.0–34.0)
MCHC: 35.4 g/dL (ref 30.0–36.0)
MCV: 94.7 fL (ref 78.0–100.0)
PLATELETS: 223 10*3/uL (ref 150–400)
RBC: 4.33 MIL/uL (ref 4.22–5.81)
RDW: 13.6 % (ref 11.5–15.5)
WBC: 6.2 10*3/uL (ref 4.0–10.5)

## 2017-03-05 NOTE — Progress Notes (Signed)
LOV 03/04/17 chart Lydia Guiles NP EKG 09/28/16 chart

## 2017-03-08 ENCOUNTER — Encounter (HOSPITAL_COMMUNITY): Payer: Self-pay | Admitting: Anesthesiology

## 2017-03-08 NOTE — Anesthesia Preprocedure Evaluation (Addendum)
Anesthesia Evaluation  Patient identified by MRN, date of birth, ID band Patient awake    Reviewed: Allergy & Precautions, NPO status , Patient's Chart, lab work & pertinent test results  Airway Mallampati: I       Dental  (+) Poor Dentition, Missing   Pulmonary neg pulmonary ROS,    Pulmonary exam normal breath sounds clear to auscultation       Cardiovascular hypertension, Pt. on medications Normal cardiovascular exam Rhythm:Regular Rate:Normal     Neuro/Psych PSYCHIATRIC DISORDERS Anxiety negative neurological ROS     GI/Hepatic negative GI ROS, Neg liver ROS,   Endo/Other  negative endocrine ROS  Renal/GU negative Renal ROS  negative genitourinary   Musculoskeletal   Abdominal Normal abdominal exam  (+)   Peds  Hematology negative hematology ROS (+)   Anesthesia Other Findings   Reproductive/Obstetrics                            Anesthesia Physical Anesthesia Plan  ASA: II  Anesthesia Plan: General   Post-op Pain Management:    Induction: Intravenous  PONV Risk Score and Plan: 3 and Ondansetron, Dexamethasone, Midazolam and Treatment may vary due to age or medical condition  Airway Management Planned: Oral ETT  Additional Equipment:   Intra-op Plan:   Post-operative Plan: Extubation in OR  Informed Consent: I have reviewed the patients History and Physical, chart, labs and discussed the procedure including the risks, benefits and alternatives for the proposed anesthesia with the patient or authorized representative who has indicated his/her understanding and acceptance.   Dental advisory given  Plan Discussed with: CRNA and Surgeon  Anesthesia Plan Comments:        Anesthesia Quick Evaluation                                   Anesthesia Evaluation   Patient awake    Reviewed: Allergy & Precautions, NPO status , Patient's Chart, lab work & pertinent test  results  History of Anesthesia Complications Negative for: history of anesthetic complications  Airway Mallampati: II  TM Distance: >3 FB Neck ROM: Full    Dental  (+) Teeth Intact   Pulmonary neg pulmonary ROS,    breath sounds clear to auscultation       Cardiovascular hypertension, Pt. on medications (-) angina(-) Past MI and (-) CHF  Rhythm:Regular     Neuro/Psych PSYCHIATRIC DISORDERS Anxiety negative neurological ROS     GI/Hepatic negative GI ROS, Neg liver ROS,   Endo/Other  negative endocrine ROS  Renal/GU negative Renal ROS     Musculoskeletal  (+) Arthritis ,   Abdominal   Peds  Hematology negative hematology ROS (+)   Anesthesia Other Findings   Reproductive/Obstetrics                             Anesthesia Physical Anesthesia Plan  ASA: II  Anesthesia Plan: General   Post-op Pain Management:    Induction: Intravenous  Airway Management Planned: Oral ETT  Additional Equipment: None  Intra-op Plan:   Post-operative Plan: Extubation in OR  Informed Consent: I have reviewed the patients History and Physical, chart, labs and discussed the procedure including the risks, benefits and alternatives for the proposed anesthesia with the patient or authorized representative who has indicated his/her understanding and acceptance.   Dental  advisory given  Plan Discussed with: CRNA and Surgeon  Anesthesia Plan Comments:         Anesthesia Quick Evaluation

## 2017-03-09 ENCOUNTER — Ambulatory Visit (HOSPITAL_COMMUNITY): Payer: Medicare Other | Admitting: Anesthesiology

## 2017-03-09 ENCOUNTER — Ambulatory Visit (HOSPITAL_COMMUNITY)
Admission: RE | Admit: 2017-03-09 | Discharge: 2017-03-09 | Disposition: A | Discharge status: Home / Self Care | Payer: Medicare Other | Source: Ambulatory Visit | Attending: General Surgery | Admitting: General Surgery

## 2017-03-09 ENCOUNTER — Encounter (HOSPITAL_COMMUNITY): Payer: Self-pay | Admitting: *Deleted

## 2017-03-09 ENCOUNTER — Encounter (HOSPITAL_COMMUNITY)
Admission: RE | Disposition: A | Discharge status: Home / Self Care | Payer: Self-pay | Source: Ambulatory Visit | Attending: General Surgery

## 2017-03-09 DIAGNOSIS — R001 Bradycardia, unspecified: Secondary | ICD-10-CM | POA: Diagnosis not present

## 2017-03-09 DIAGNOSIS — I10 Essential (primary) hypertension: Secondary | ICD-10-CM | POA: Diagnosis not present

## 2017-03-09 DIAGNOSIS — Z79899 Other long term (current) drug therapy: Secondary | ICD-10-CM | POA: Diagnosis not present

## 2017-03-09 DIAGNOSIS — F419 Anxiety disorder, unspecified: Secondary | ICD-10-CM | POA: Insufficient documentation

## 2017-03-09 DIAGNOSIS — K4090 Unilateral inguinal hernia, without obstruction or gangrene, not specified as recurrent: Secondary | ICD-10-CM | POA: Insufficient documentation

## 2017-03-09 DIAGNOSIS — Z7982 Long term (current) use of aspirin: Secondary | ICD-10-CM | POA: Diagnosis not present

## 2017-03-09 DIAGNOSIS — M4316 Spondylolisthesis, lumbar region: Secondary | ICD-10-CM | POA: Diagnosis not present

## 2017-03-09 HISTORY — PX: INSERTION OF MESH: SHX5868

## 2017-03-09 HISTORY — PX: INGUINAL HERNIA REPAIR: SHX194

## 2017-03-09 SURGERY — REPAIR, HERNIA, INGUINAL, ADULT
Anesthesia: General | Site: Abdomen | Laterality: Right

## 2017-03-09 MED ORDER — CEFAZOLIN SODIUM-DEXTROSE 2-4 GM/100ML-% IV SOLN
2.0000 g | INTRAVENOUS | Status: AC
  Administered 2017-03-09: 2 g via INTRAVENOUS

## 2017-03-09 MED ORDER — ONDANSETRON HCL 4 MG/2ML IJ SOLN: 4.0000 mg | Freq: Once | INTRAMUSCULAR | Status: DC | PRN

## 2017-03-09 MED ORDER — ACETAMINOPHEN 325 MG PO TABS: 325.0000 mg | ORAL_TABLET | ORAL | Status: DC | PRN

## 2017-03-09 MED ORDER — OXYCODONE HCL 5 MG PO TABS: 5.0000 mg | ORAL_TABLET | ORAL | Status: DC | PRN

## 2017-03-09 MED ORDER — FENTANYL CITRATE (PF) 100 MCG/2ML IJ SOLN
INTRAMUSCULAR | Status: AC
  Filled 2017-03-09: qty 2

## 2017-03-09 MED ORDER — OXYCODONE HCL 5 MG PO TABS: 5.0000 mg | ORAL_TABLET | Freq: Once | ORAL | Status: DC | PRN

## 2017-03-09 MED ORDER — KETAMINE HCL 10 MG/ML IJ SOLN
INTRAMUSCULAR | Status: DC | PRN
  Administered 2017-03-09: 35 mg via INTRAVENOUS

## 2017-03-09 MED ORDER — KETOROLAC TROMETHAMINE 30 MG/ML IJ SOLN
INTRAMUSCULAR | Status: AC
  Filled 2017-03-09: qty 1

## 2017-03-09 MED ORDER — LIDOCAINE 2% (20 MG/ML) 5 ML SYRINGE
INTRAMUSCULAR | Status: AC
  Filled 2017-03-09: qty 5

## 2017-03-09 MED ORDER — ACETAMINOPHEN 500 MG PO TABS
1000.0000 mg | ORAL_TABLET | ORAL | Status: AC
  Administered 2017-03-09: 1000 mg via ORAL
  Filled 2017-03-09: qty 2

## 2017-03-09 MED ORDER — PROPOFOL 10 MG/ML IV BOLUS
INTRAVENOUS | Status: AC
  Filled 2017-03-09: qty 20

## 2017-03-09 MED ORDER — ROCURONIUM BROMIDE 50 MG/5ML IV SOSY
PREFILLED_SYRINGE | INTRAVENOUS | Status: DC | PRN
  Administered 2017-03-09: 50 mg via INTRAVENOUS

## 2017-03-09 MED ORDER — KETAMINE HCL-SODIUM CHLORIDE 100-0.9 MG/10ML-% IV SOSY
PREFILLED_SYRINGE | INTRAVENOUS | Status: AC
  Filled 2017-03-09: qty 10

## 2017-03-09 MED ORDER — MEPERIDINE HCL 50 MG/ML IJ SOLN: 6.2500 mg | INTRAMUSCULAR | Status: DC | PRN

## 2017-03-09 MED ORDER — LACTATED RINGERS IV SOLN
INTRAVENOUS | Status: DC | PRN
  Administered 2017-03-09 (×2): via INTRAVENOUS

## 2017-03-09 MED ORDER — CHLORHEXIDINE GLUCONATE CLOTH 2 % EX PADS: 6.0000 | MEDICATED_PAD | Freq: Once | CUTANEOUS | Status: DC

## 2017-03-09 MED ORDER — EPHEDRINE SULFATE-NACL 50-0.9 MG/10ML-% IV SOSY
PREFILLED_SYRINGE | INTRAVENOUS | Status: DC | PRN
  Administered 2017-03-09 (×2): 15 mg via INTRAVENOUS
  Administered 2017-03-09: 10 mg via INTRAVENOUS

## 2017-03-09 MED ORDER — DEXAMETHASONE SODIUM PHOSPHATE 10 MG/ML IJ SOLN
INTRAMUSCULAR | Status: DC | PRN
  Administered 2017-03-09: 10 mg via INTRAVENOUS

## 2017-03-09 MED ORDER — SUGAMMADEX SODIUM 200 MG/2ML IV SOLN
INTRAVENOUS | Status: AC
  Filled 2017-03-09: qty 2

## 2017-03-09 MED ORDER — MIDAZOLAM HCL 5 MG/5ML IJ SOLN
INTRAMUSCULAR | Status: DC | PRN
  Administered 2017-03-09: 2 mg via INTRAVENOUS

## 2017-03-09 MED ORDER — FENTANYL CITRATE (PF) 100 MCG/2ML IJ SOLN
INTRAMUSCULAR | Status: DC | PRN
  Administered 2017-03-09: 50 ug via INTRAVENOUS

## 2017-03-09 MED ORDER — PHENYLEPHRINE 40 MCG/ML (10ML) SYRINGE FOR IV PUSH (FOR BLOOD PRESSURE SUPPORT)
PREFILLED_SYRINGE | INTRAVENOUS | Status: AC
  Filled 2017-03-09: qty 10

## 2017-03-09 MED ORDER — KETOROLAC TROMETHAMINE 30 MG/ML IJ SOLN
30.0000 mg | Freq: Once | INTRAMUSCULAR | Status: DC | PRN
  Administered 2017-03-09: 30 mg via INTRAVENOUS

## 2017-03-09 MED ORDER — SUCCINYLCHOLINE CHLORIDE 200 MG/10ML IV SOSY
PREFILLED_SYRINGE | INTRAVENOUS | Status: DC | PRN
  Administered 2017-03-09: 120 mg via INTRAVENOUS

## 2017-03-09 MED ORDER — LIDOCAINE 2% (20 MG/ML) 5 ML SYRINGE
INTRAMUSCULAR | Status: DC | PRN
  Administered 2017-03-09: 1.5 mg/kg/h via INTRAVENOUS

## 2017-03-09 MED ORDER — BUPIVACAINE-EPINEPHRINE (PF) 0.25% -1:200000 IJ SOLN
INTRAMUSCULAR | Status: AC
  Filled 2017-03-09: qty 30

## 2017-03-09 MED ORDER — PROPOFOL 10 MG/ML IV BOLUS
INTRAVENOUS | Status: DC | PRN
  Administered 2017-03-09: 170 mg via INTRAVENOUS

## 2017-03-09 MED ORDER — ACETAMINOPHEN 650 MG RE SUPP: 650.0000 mg | RECTAL | Status: DC | PRN

## 2017-03-09 MED ORDER — CEFAZOLIN SODIUM-DEXTROSE 2-4 GM/100ML-% IV SOLN
INTRAVENOUS | Status: AC
  Filled 2017-03-09: qty 100

## 2017-03-09 MED ORDER — SODIUM CHLORIDE 0.9% FLUSH: 3.0000 mL | INTRAVENOUS | Status: DC | PRN

## 2017-03-09 MED ORDER — OXYCODONE HCL 5 MG/5ML PO SOLN
5.0000 mg | Freq: Once | ORAL | Status: DC | PRN
  Filled 2017-03-09: qty 5

## 2017-03-09 MED ORDER — ACETAMINOPHEN 325 MG PO TABS: 650.0000 mg | ORAL_TABLET | ORAL | Status: DC | PRN

## 2017-03-09 MED ORDER — MIDAZOLAM HCL 2 MG/2ML IJ SOLN
INTRAMUSCULAR | Status: AC
  Filled 2017-03-09: qty 2

## 2017-03-09 MED ORDER — ACETAMINOPHEN 160 MG/5ML PO SOLN: 325.0000 mg | ORAL | Status: DC | PRN

## 2017-03-09 MED ORDER — PHENYLEPHRINE 40 MCG/ML (10ML) SYRINGE FOR IV PUSH (FOR BLOOD PRESSURE SUPPORT)
PREFILLED_SYRINGE | INTRAVENOUS | Status: DC | PRN
  Administered 2017-03-09 (×2): 80 ug via INTRAVENOUS

## 2017-03-09 MED ORDER — 0.9 % SODIUM CHLORIDE (POUR BTL) OPTIME
TOPICAL | Status: DC | PRN
  Administered 2017-03-09: 1000 mL

## 2017-03-09 MED ORDER — OXYCODONE HCL 5 MG PO TABS: 5.0000 mg | ORAL_TABLET | ORAL | 0 refills | Status: DC | PRN

## 2017-03-09 MED ORDER — BUPIVACAINE-EPINEPHRINE 0.25% -1:200000 IJ SOLN
INTRAMUSCULAR | Status: DC | PRN
  Administered 2017-03-09: 30 mL

## 2017-03-09 MED ORDER — EPHEDRINE 5 MG/ML INJ
INTRAVENOUS | Status: AC
  Filled 2017-03-09: qty 10

## 2017-03-09 MED ORDER — FENTANYL CITRATE (PF) 100 MCG/2ML IJ SOLN
25.0000 ug | INTRAMUSCULAR | Status: DC | PRN
  Administered 2017-03-09: 50 ug via INTRAVENOUS
  Administered 2017-03-09: 25 ug via INTRAVENOUS

## 2017-03-09 MED ORDER — GABAPENTIN 300 MG PO CAPS
300.0000 mg | ORAL_CAPSULE | ORAL | Status: AC
  Administered 2017-03-09: 300 mg via ORAL
  Filled 2017-03-09: qty 1

## 2017-03-09 MED ORDER — ONDANSETRON HCL 4 MG/2ML IJ SOLN
INTRAMUSCULAR | Status: AC
  Filled 2017-03-09: qty 2

## 2017-03-09 MED ORDER — SODIUM CHLORIDE 0.9% FLUSH: 3.0000 mL | Freq: Two times a day (BID) | INTRAVENOUS | Status: DC

## 2017-03-09 MED ORDER — LIDOCAINE 2% (20 MG/ML) 5 ML SYRINGE
INTRAMUSCULAR | Status: DC | PRN
  Administered 2017-03-09: 80 mg via INTRAVENOUS

## 2017-03-09 MED ORDER — SODIUM CHLORIDE 0.9 % IV SOLN: 250.0000 mL | INTRAVENOUS | Status: DC | PRN

## 2017-03-09 SURGICAL SUPPLY — 44 items
ADH SKN CLS APL DERMABOND .7 (GAUZE/BANDAGES/DRESSINGS) ×2
APL SKNCLS STERI-STRIP NONHPOA (GAUZE/BANDAGES/DRESSINGS) ×2
APPLIER CLIP 5 13 M/L LIGAMAX5 (MISCELLANEOUS)
APR CLP MED LRG 5 ANG JAW (MISCELLANEOUS)
BANDAGE ADH SHEER 1  50/CT (GAUZE/BANDAGES/DRESSINGS) IMPLANT
BENZOIN TINCTURE PRP APPL 2/3 (GAUZE/BANDAGES/DRESSINGS) ×3 IMPLANT
CABLE HIGH FREQUENCY MONO STRZ (ELECTRODE) ×3 IMPLANT
CHLORAPREP W/TINT 26ML (MISCELLANEOUS) ×3 IMPLANT
CLIP APPLIE 5 13 M/L LIGAMAX5 (MISCELLANEOUS) IMPLANT
COVER SURGICAL LIGHT HANDLE (MISCELLANEOUS) ×3 IMPLANT
DECANTER SPIKE VIAL GLASS SM (MISCELLANEOUS) ×3 IMPLANT
DERMABOND ADVANCED (GAUZE/BANDAGES/DRESSINGS) ×1
DERMABOND ADVANCED .7 DNX12 (GAUZE/BANDAGES/DRESSINGS) ×2 IMPLANT
DEVICE SECURE STRAP 25 ABSORB (INSTRUMENTS) IMPLANT
DRAIN PENROSE 18X1/2 LTX STRL (DRAIN) ×2 IMPLANT
DRSG TEGADERM 2-3/8X2-3/4 SM (GAUZE/BANDAGES/DRESSINGS) IMPLANT
DRSG TEGADERM 4X4.75 (GAUZE/BANDAGES/DRESSINGS) IMPLANT
ELECT PENCIL ROCKER SW 15FT (MISCELLANEOUS) ×2 IMPLANT
ELECT REM PT RETURN 15FT ADLT (MISCELLANEOUS) ×3 IMPLANT
GAUZE SPONGE 2X2 8PLY STRL LF (GAUZE/BANDAGES/DRESSINGS) IMPLANT
GAUZE SPONGE 4X4 12PLY STRL (GAUZE/BANDAGES/DRESSINGS) ×2 IMPLANT
GLOVE BIO SURGEON STRL SZ7.5 (GLOVE) ×3 IMPLANT
GLOVE INDICATOR 8.0 STRL GRN (GLOVE) ×3 IMPLANT
GOWN STRL REUS W/TWL XL LVL3 (GOWN DISPOSABLE) ×9 IMPLANT
KIT BASIN OR (CUSTOM PROCEDURE TRAY) ×3 IMPLANT
L-HOOK LAP DISP 36CM (ELECTROSURGICAL)
LHOOK LAP DISP 36CM (ELECTROSURGICAL) IMPLANT
MESH ULTRAPRO 3X6 7.6X15CM (Mesh General) ×2 IMPLANT
SCISSORS LAP 5X35 DISP (ENDOMECHANICALS) ×3 IMPLANT
SET IRRIG TUBING LAPAROSCOPIC (IRRIGATION / IRRIGATOR) IMPLANT
SLEEVE XCEL OPT CAN 5 100 (ENDOMECHANICALS) ×3 IMPLANT
SPONGE GAUZE 2X2 STER 10/PKG (GAUZE/BANDAGES/DRESSINGS)
STRIP CLOSURE SKIN 1/2X4 (GAUZE/BANDAGES/DRESSINGS) ×3 IMPLANT
SUT MNCRL AB 4-0 PS2 18 (SUTURE) ×3 IMPLANT
SUT NOVA NAB DX-16 0-1 5-0 T12 (SUTURE) IMPLANT
SUT NOVA NAB GS-21 0 18 T12 DT (SUTURE) IMPLANT
SUT PROLENE 2 0 CT2 30 (SUTURE) ×4 IMPLANT
SUT VIC AB 3-0 SH 8-18 (SUTURE) ×2 IMPLANT
TOWEL OR 17X26 10 PK STRL BLUE (TOWEL DISPOSABLE) ×3 IMPLANT
TOWEL OR NON WOVEN STRL DISP B (DISPOSABLE) ×3 IMPLANT
TRAY LAPAROSCOPIC (CUSTOM PROCEDURE TRAY) ×3 IMPLANT
TROCAR BLADELESS OPT 5 100 (ENDOMECHANICALS) ×3 IMPLANT
TROCAR XCEL BLUNT TIP 100MML (ENDOMECHANICALS) ×3 IMPLANT
TUBING INSUF HEATED (TUBING) IMPLANT

## 2017-03-09 NOTE — Anesthesia Procedure Notes (Signed)
Procedure Name: Intubation Date/Time: 03/09/2017 7:32 AM Performed by: [place] Krystian Ferrentino C Pre-anesthesia Checklist: Patient identified, Emergency Drugs available, Suction available and Patient being monitored Patient Re-evaluated:Patient Re-evaluated prior to induction Oxygen Delivery Method: Circle system utilized Preoxygenation: Pre-oxygenation with 100% oxygen Induction Type: IV induction Ventilation: Mask ventilation without difficulty Laryngoscope Size: Mac and 4 Grade View: Grade I Tube type: Oral Tube size: 7.5 mm Number of attempts: 1 Airway Equipment and Method: Stylet and Oral airway Placement Confirmation: ETT inserted through vocal cords under direct vision,  positive ETCO2 and breath sounds checked- equal and bilateral Tube secured with: Tape Dental Injury: Teeth and Oropharynx as per pre-operative assessment

## 2017-03-09 NOTE — Anesthesia Postprocedure Evaluation (Signed)
Anesthesia Post Note  Patient: Javier Ewing  Procedure(s) Performed: RIGHT INGUINAL HERNIA REPAIR (Right Abdomen) INSERTION OF MESH (Right Abdomen)     Patient location during evaluation: PACU Anesthesia Type: General Level of consciousness: sedated Pain management: pain level controlled Vital Signs Assessment: post-procedure vital signs reviewed and stable Respiratory status: spontaneous breathing Cardiovascular status: stable Postop Assessment: no apparent nausea or vomiting Anesthetic complications: no Comments: ECG essentially unchanged from prior. No evidence for ischemia.     Last Vitals:  Vitals:   03/09/17 0930 03/09/17 0945  BP: 133/82 136/89  Pulse: 83 82  Resp: 14 12  Temp:    SpO2: 100% 100%    Last Pain:  Vitals:   03/09/17 0924  TempSrc:   PainSc: 0-No pain   Pain Goal: Patients Stated Pain Goal: 3 (03/09/17 0533)               Gwenette Wellons JR,JOHN Susann Givens

## 2017-03-09 NOTE — Transfer of Care (Signed)
Immediate Anesthesia Transfer of Care Note  Patient: ERVAN HEBER  Procedure(s) Performed: RIGHT INGUINAL HERNIA REPAIR (Right Abdomen) INSERTION OF MESH (Right Abdomen)  Patient Location: PACU  Anesthesia Type:General  Level of Consciousness: awake and alert   Airway & Oxygen Therapy: Patient Spontanous Breathing and Patient connected to face mask oxygen  Post-op Assessment: Report given to RN and Post -op Vital signs reviewed and stable  Post vital signs: Reviewed and stable  Last Vitals:  Vitals:   03/09/17 0525  BP: (!) 122/100  Pulse: 87  Resp: 18  Temp: 36.5 C  SpO2: 94%    Last Pain:  Vitals:   03/09/17 0525  TempSrc: Oral      Patients Stated Pain Goal: 3 (03/09/17 0533)  Complications: No apparent anesthesia complications

## 2017-03-09 NOTE — Progress Notes (Signed)
Dr. Tobias Alexander from Cardiology saw patient at bedside Short Stay. OK to discharge home. Dr. Delton See instructed patient to call if he experiences slow heart rate or dizziness at home and if he wishes, to follow up in their clinic in 2 weeks. Mr. Javier Ewing verbalized understanding.

## 2017-03-09 NOTE — Interval H&P Note (Signed)
History and Physical Interval Note:  03/09/2017 7:24 AM  Javier Ewing  has presented today for surgery, with the diagnosis of right inguinal hernia  The various methods of treatment have been discussed with the patient and family. After consideration of risks, benefits and other options for treatment, the patient has consented to  Procedure(s): LAPAROSCOPIC RIGHT INGUINAL HERNIA ERAS PATHWAY (Right) INSERTION OF MESH (Right) as a surgical intervention .  The patient's history has been reviewed, patient examined, no change in status, stable for surgery.  I have reviewed the patient's chart and labs.  Questions were answered to the patient's satisfaction.    Mary Sella. Andrey Campanile, MD, FACS General, Bariatric, & Minimally Invasive Surgery [facility] Surgery, [place]  [facility]: [place] Rhonda W M

## 2017-03-09 NOTE — Consult Note (Signed)
Cardiology Consultation:   Patient ID: Javier Ewing; [id number]; 1946/01/27   Admit date: 03/09/2017 Date of Consult: 03/09/2017  Primary Care Provider: Geoffry Paradise, MD Primary Cardiologist: new patient  Patient Profile:   Javier Ewing is a 71 y.o. male with a hx of hypertension, inguinal hernia who is being seen today for the evaluation of junctional rhythm post anesthesia at the request of Leilani Able, MD.  History of Present Illness:   Javier Ewing this 71 year old male with no prior history of heart disease,only history of hypertension currently on no medication who underwent an outpatient right inguinal repair. Anesthesiologist has noticed that while his belly was insufflated he currently went to junctional rhythm. This has resolved several minutes later postop in sinus rhythm and currently in sinus rhythm completely asymptomatic of any dizziness, shortness of breath or chest pain. He has mild pain in his surgical site.  RIGHT INGUINAL HERNIA REPAIR (Right Abdomen) INSERTION OF MESH (Right Abdomen)  Past Medical History:  Diagnosis Date  . Anxiety   . Arthritis    spine  . History of kidney stones   . History of nephrolithiasis   . Hypertension    Past Surgical History:  Procedure Laterality Date  . ANTERIOR CERVICAL DECOMP/DISCECTOMY FUSION  03/08/2012   Procedure: ANTERIOR CERVICAL DECOMPRESSION/DISCECTOMY FUSION 2 LEVELS;  Surgeon: Maeola Harman, MD;  Location: [facility];  Service: Neurosurgery;  Laterality: N/A;  Cervical four-five Cervical five six Anterior cervical decompression/diskectomy/fusion  . HERNIA REPAIR     Right inguinal w/ mesh Dr. Andrey Campanile 03/09/17  . MAXIMUM ACCESS (MAS)POSTERIOR LUMBAR INTERBODY FUSION (PLIF) 2 LEVEL N/A 04/23/2015   Procedure: LUMBAR FOUR-FIVE,  LUMBAR FIVE-SACRAL ONE MAXIMUM ACCESS (MAS) POSTERIOR LUMBAR INTERBODY FUSION (PLIF) ;  Surgeon: Maeola Harman, MD;  Location: [facility];  Service: Neurosurgery;  Laterality: N/A;  .  URETERAL STENT PLACEMENT     laser lithotripsy with left ureter stent 03/05/15   Home Medications:  Prior to Admission medications   Medication Sig Start Date End Date Taking? Authorizing Provider  aspirin EC 81 MG tablet Take 81 mg by mouth at bedtime.    Yes [provider]  Cholecalciferol (VITAMIN D-3 PO) Take 1 tablet by mouth daily.   Yes [provider]  citalopram (CELEXA) 10 MG tablet Take 10 mg by mouth at bedtime.    Yes [provider]  Cyanocobalamin (VITAMIN B-12) 2500 MCG SUBL Place 2,500 mcg under the tongue daily.   Yes [provider]  losartan-hydrochlorothiazide (HYZAAR) 100-12.5 MG tablet Take 1 tablet by mouth daily.   Yes [provider]  Multiple Vitamin (MULITIVITAMIN WITH MINERALS) TABS Take 1 tablet by mouth at bedtime.    Yes [provider]  naproxen sodium (ANAPROX) 220 MG tablet Take 220 mg by mouth 2 (two) times daily as needed (for pain.).   Yes [provider]  Probiotic Product (ALIGN) 4 MG CAPS Take 1 capsule by mouth every other day.    Yes [provider]   Inpatient Medications: Scheduled Meds: . Chlorhexidine Gluconate Cloth  6 each Topical Once   And  . Chlorhexidine Gluconate Cloth  6 each Topical Once  . fentaNYL      . ketorolac       Continuous Infusions:  PRN Meds: acetaminophen **OR** acetaminophen (TYLENOL) oral liquid 160 mg/5 mL, fentaNYL (SUBLIMAZE) injection, ketorolac, meperidine (DEMEROL) injection, ondansetron (ZOFRAN) IV, oxyCODONE **OR** oxyCODONE  Allergies:   No Known Allergies  Social History:   Social History  Social History  . Marital status: Married    Spouse name: N/A  . Number of children: N/A  . Years of education: N/A   Occupational History  . Not on file.   Social History Main Topics  . Smoking status: Never Smoker  . Smokeless tobacco: Never Used  . Alcohol use No  . Drug use: No  . Sexual activity: Yes   Other Topics Concern  .  Not on file   Social History Narrative  . No narrative on file    Family History:   History reviewed. No pertinent family history.   ROS:  Please see the history of present illness.  ROS  All other ROS reviewed and negative.     Physical Exam/Data:   Vitals:   03/09/17 1030 03/09/17 1045 03/09/17 1050 03/09/17 1106  BP: 135/87 138/89  127/84  Pulse: 77 77  77  Resp: Temp:   97.6 F (36.4 C) (!) 97.3 F (36.3 C)  TempSrc:      SpO2: 98% 99%  97%  Weight:      Height:        Intake/Output Summary (Last 24 hours) at 03/09/17 1133 Last data filed at 03/09/17 1129  Gross per 24 hour  Intake             1690 ml  Output              220 ml  Net             1470 ml   Filed Weights   03/09/17 0533  Weight: 167 lb (75.8 kg)   Body mass index is 23.96 kg/m.  General:  Well nourished, well developed, in no acute distress HEENT: normal Lymph: no adenopathy Neck: no JVD Endocrine:  No thryomegaly Vascular: No carotid bruits; FA pulses 2+ bilaterally without bruits  Cardiac:  normal S1, S2; RRR; no murmur  Lungs:  clear to auscultation bilaterally, no wheezing, rhonchi or rales  Abd: soft, nontender, no hepatomegaly  Ext: no edema Musculoskeletal:  No deformities, BUE and BLE strength normal and equal Skin: warm and dry  Neuro:  CNs 2-12 intact, no focal abnormalities noted Psych:  Normal affect   EKG:  The EKG was personally reviewed and demonstrates:  SR, LVH, PVCs, nonspecific T wave abnormalities Telemetry:  Telemetry was personally reviewed and demonstrates:  SR  Relevant CV Studies: Echo - none in Epic  Laboratory Data:  Chemistry  Recent Labs Lab 03/05/17 0947  NA 138  K 3.9  CL 104  CO2 24  GLUCOSE 92  BUN 22*  CREATININE 0.77  CALCIUM 9.9  GFRNONAA >60  GFRAA >60  ANIONGAP 10    No results for input(s): PROT, ALBUMIN, AST, ALT, ALKPHOS, BILITOT in the last 168 hours. Hematology  Recent Labs Lab 03/05/17 0947  WBC 6.2  RBC  4.33  HGB 14.5  HCT 41.0  MCV 94.7  MCH 33.5  MCHC 35.4  RDW 13.6  PLT 223   Cardiac EnzymesNo results for input(s): TROPONINI in the last 168 hours. No results for input(s): TROPIPOC in the last 168 hours.  BNPNo results for input(s): BNP, PROBNP in the last 168 hours.  DDimer No results for input(s): DDIMER in the last 168 hours.  Radiology/Studies:  No results found.  Assessment and Plan:   1. Intermittent junctional bradycardia - during laparoscopic inguinal repair, there is no evidence of the rhythm however patient recovered completely and is asymptomatic with  no underlying structural heart disease. His vitals are stable, his EKG shows sinus rhythm with LVH, PVCs and nonspecific T-wave abnormalities. There is no workup needed at this point. If patient has any symptoms of dizziness or bradycardia in the future he is welcome to contact our clinic at [phone].  For questions or updates, please contact [facility] Please consult [email or web address] for contact info under Cardiology/STEMI.   Signed, Tobias Alexander, MD  03/09/2017 11:33 AM

## 2017-03-09 NOTE — Op Note (Signed)
MUKUND WEINREB [id number] [birth year] 03/09/2017   Laparoscopic converted to Open Right indirect Inguinal Hernia Repair with Mesh Procedure Note  Indications: The patient presented with a history of a right, reducible hernia.    Pre-operative Diagnosis: right reducible  Post-operative Diagnosis: right indirect inguinal hernia  Surgeon: Atilano Ina   Assistants: none  Anesthesia: General endotracheal anesthesia  Surgeon: Mary Sella. Andrey Campanile, MD, FACS  Procedure Details  The patient was seen again in the Holding Room. The risks, benefits, complications, treatment options, and expected outcomes were discussed with the patient. The possibilities of reaction to medication, pulmonary aspiration, perforation of viscus, bleeding, recurrent infection, the need for additional procedures, and development of a complication requiring transfusion or further operation were discussed with the patient and/or family. The likelihood of success in repairing the hernia and returning the patient to their previous functional status is good.  There was concurrence with the proposed plan, and informed consent was obtained. The site of surgery was properly noted/marked. The patient was taken to the Operating Room, identified as KAUSHIK MAUL, and the procedure verified as right inguinal hernia repair. A Time Out was held and the above information confirmed.    The patient was placed in the supine position and underwent induction of anesthesia. The lower abdomen and groin was prepped with Chloraprep and draped in the standard fashion.  A small infraumbilical incision was made with an 11 blade. The fascia was incised and grasped with Kocher's. The abdominal cavity was entered and a pursestring suture was placed around the fascial edges with 0 Vicryl.A 12 mm Hassan trocar and pneumoperitoneum was established. The laparoscope was introduced and a right indirect inguinal hernia was visualized. However shortly after insufflation  the patient had a change in his EKG rhythm. Pneumoperitoneum was released and his rhythm returned to his baseline. The anesthesiologist came into the OR and we reestablished pneumoperitoneum. The patient again went into a what appeared to be a junctional rhythm. Pneumoperitoneum was released and the patient returned to his normal baseline rhythm. The decision was made not to proceed laparoscopically because of the intolerance to pneumoperitoneum and the procedure was converted to an open procedure.   .25% Marcaine with epinephrine was used to anesthetize the skin over the mid-portion of the inguinal canal. An oblique incision was made. Dissection was carried down through the subcutaneous tissue with cautery to the external oblique fascia.  We opened the external oblique fascia along the direction of its fibers to the external ring.  The spermatic cord was circumferentially dissected bluntly and retracted with a Penrose drain.  The floor of the inguinal canal was inspected and there was no direct defect.  We skeletonized the spermatic cord and isolated the hernia sac.  We used a 3 x 6 inch piece of Ultrapro mesh, which was cut into a keyhole shape.  This was secured with 2-0 Prolene, beginning at the pubic tubercle, running this along the shelving edge inferiorly. Superiorly, the mesh was secured to the internal oblique fascia with interrupted 2-0 Prolene sutures.  The tails of the mesh were sutured together behind the spermatic cord.  The mesh was tucked underneath the external oblique fascia laterally.  The external oblique fascia was reapproximated with 2-0 Vicryl.  3-0 Vicryl was used to close the subcutaneous tissues and 4-0 Monocryl was used to close the skin in subcuticular fashion.  Benzoin and steri-strips were used to seal the incision.  A clean dressing was applied.  The patient was then extubated and  brought to the recovery room in stable condition.  All sponge, instrument, and needle counts were  correct prior to closure and at the conclusion of the case.   Estimated Blood Loss: Minimal                 Complications: None; patient tolerated the procedure well.         Disposition: PACU - hemodynamically stable.         Condition: stable  Mary Sella. Andrey Campanile, MD, FACS General, Bariatric, & Minimally Invasive Surgery [facility], [place]

## 2017-03-09 NOTE — H&P (Signed)
Javier Ewing 03/04/2017 11:32 AM Location: [facility] Patient #: [id number] DOB: [birth year] Married / Language: English / Race: White Male   History of Present Illness Javier Areola M. Ketra Duchesne MD; 03/04/2017 1:22 PM) The patient is a 71 year old male who presents with an inguinal hernia. He is referred by Dr Javier Ewing for evaluation of a right inguinal hernia. He states that he occasionally has IBS symptoms in after having a loose bowel movement he strained and then noticed some discomfort as well as a lump in his right groin. It mainly causes discomfort toward the end of the day or after standing for a while. He denies any burning, shooting or stabbing pain. He denies any nausea or vomiting. He does endorse some BPH symptoms such as nocturia, some incomplete sensation of voiding, and hesitancy. He denies any chest pain, chest pressure, shortness of breath, orthopnea, TIAs, or amaurosis fugax. He denies any prior abdominal surgery. He is accompanied by his wife   Problem List/Past Medical Javier Areola M. Andrey Campanile, MD; 03/04/2017 1:23 PM) RIGHT INGUINAL HERNIA (K40.90)   Past Surgical History Javier Ewing, [place]; 03/04/2017 11:32 AM) Colon Polyp Removal - Colonoscopy  Spinal Surgery - Lower Back  Spinal Surgery - Neck   Diagnostic Studies History Javier Ewing, RMA; 03/04/2017 11:32 AM) Colonoscopy  1-5 years ago  Allergies Javier Ewing, RMA; 03/04/2017 11:33 AM) No Known Allergies 03/04/2017  Medication History Javier Areola M. Andrey Campanile, MD; 03/04/2017 1:23 PM) Citalopram Hydrobromide (  Tablet, Oral) Active. Fish Oil (  Capsule, Oral) Active. Losartan Potassium-HCTZ (100-12.5MG  Tablet, Oral) Active. Vitamin D (1000UNIT Tablet, Oral) Active. Medications Reconciled Tamsulosin HCl (0.4MG  Capsule, 1 (one) Capsule Oral at bedtime, Taken starting 03/04/2017) Active.  Social History Javier Ewing, [place]; 03/04/2017 11:32 AM) Alcohol use  Occasional alcohol use. Caffeine  use  Carbonated beverages, Coffee, Tea. Tobacco use  Never smoker.  Family History Javier Ewing, [place]; 03/04/2017 11:32 AM) Arthritis  Mother.  Other Problems Javier Areola M. Andrey Campanile, MD; 03/04/2017 1:23 PM) Anxiety Disorder  Back Pain  High blood pressure  Inguinal Hernia  Kidney Stone     Review of Systems Javier Ewing RMA; 03/04/2017 11:32 AM) General Not Present- Appetite Loss, Chills, Fatigue, Fever, Night Sweats, Weight Gain and Weight Loss. Skin Not Present- Change in Wart/Mole, Dryness, Hives, Jaundice, New Lesions, Non-Healing Wounds, Rash and Ulcer. HEENT Present- Wears glasses/contact lenses. Not Present- Earache, Hearing Loss, Hoarseness, Nose Bleed, Oral Ulcers, Ringing in the Ears, Seasonal Allergies, Sinus Pain, Sore Throat, Visual Disturbances and Yellow Eyes. Respiratory Present- Snoring. Not Present- Bloody sputum, Chronic Cough, Difficulty Breathing and Wheezing. Gastrointestinal Not Present- Abdominal Pain, Bloating, Bloody Stool, Change in Bowel Habits, Chronic diarrhea, Constipation, Difficulty Swallowing, Excessive gas, Gets full quickly at meals, Hemorrhoids, Indigestion, Nausea, Rectal Pain and Vomiting. Male Genitourinary Present- Frequency. Not Present- Blood in Urine, Change in Urinary Stream, Impotence, Nocturia, Painful Urination, Urgency and Urine Leakage. Musculoskeletal Present- Joint Pain and Joint Stiffness. Not Present- Back Pain, Muscle Pain, Muscle Weakness and Swelling of Extremities. Neurological Not Present- Decreased Memory, Fainting, Headaches, Numbness, Seizures, Tingling, Tremor, Trouble walking and Weakness. Psychiatric Not Present- Anxiety, Bipolar, Change in Sleep Pattern, Depression, Fearful and Frequent crying. Endocrine Not Present- Cold Intolerance, Excessive Hunger, Hair Changes, Heat Intolerance, Hot flashes and New Diabetes. Hematology Not Present- Blood Thinners, Easy Bruising, Excessive bleeding, Gland problems, HIV and  Persistent Infections.  Vitals Javier Ewing RMA; 03/04/2017 11:36 AM) 03/04/2017 11:35 AM Weight: 167.2 lb Height: 70in Body Surface Area: 1.93 m Body Mass Index: 23.99 kg/m  Temp.:  97.51F  Pulse: 58 (Regular)  BP: 142/100 (Sitting, Left Arm, Standard)       Physical Exam Javier Areola M. Olman Yono MD; 03/04/2017 1:20 PM) General Mental Status-Alert. General Appearance-Consistent with stated age. Hydration-Well hydrated. Voice-Normal.  Head and Neck Head-normocephalic, atraumatic with no lesions or palpable masses. Trachea-midline. Thyroid Gland Characteristics - normal size and consistency.  Eye Eyeball - Bilateral-Extraocular movements intact. Sclera/Conjunctiva - Bilateral-No scleral icterus.  ENMT Ears -Note: normal external ears.  Mouth and Throat -Note: lips intact.   Chest and Lung Exam Chest and lung exam reveals -quiet, even and easy respiratory effort with no use of accessory muscles and on auscultation, normal breath sounds, no adventitious sounds and normal vocal resonance. Inspection Chest Wall - Normal. Back - normal.  Breast - Did not examine.  Cardiovascular Cardiovascular examination reveals -normal heart sounds, regular rate and rhythm with no murmurs and normal pedal pulses bilaterally.  Abdomen Inspection Inspection of the abdomen reveals - No Hernias. Skin - Scar - no surgical scars. Palpation/Percussion Palpation and Percussion of the abdomen reveal - Soft, Non Tender, No Rebound tenderness, No Rigidity (guarding) and No hepatosplenomegaly. Auscultation Auscultation of the abdomen reveals - Bowel sounds normal.  Male Genitourinary Note: both testicles. pt examined standing; bulge in Rt groin with palpation of deep ring, reducible, slight tenderness; no scrotal/testicular masses; no bulge/enlarged L inguinal ring   Peripheral Vascular Upper Extremity Palpation - Pulses bilaterally  normal.  Neurologic Neurologic evaluation reveals -alert and oriented x 3 with no impairment of recent or remote memory. Mental Status-Normal.  Neuropsychiatric The patient's mood and affect are described as -normal. Judgment and Insight-insight is appropriate concerning matters relevant to self.  Musculoskeletal Normal Exam - Left-Upper Extremity Strength Normal and Lower Extremity Strength Normal. Normal Exam - Right-Upper Extremity Strength Normal and Lower Extremity Strength Normal.  Lymphatic Head & Neck  General Head & Neck Lymphatics: Bilateral - Description - Normal. Axillary - Did not examine. Femoral & Inguinal - Did not examine.    Assessment & Plan Javier Areola M. Todd Jelinski MD; 03/04/2017 1:21 PM) RIGHT INGUINAL HERNIA (K40.90) Impression: We discussed the etiology of inguinal hernias. We discussed the signs & symptoms of incarceration & strangulation. We discussed non-operative and operative management.  The patient has elected to proceed with LAPAROSCOPIC REPAIR OF RIGHT INGUINAL HERNIA WITH MESH   I described the procedure in detail. The patient was given educational material. We discussed the risks and benefits including but not limited to bleeding, infection, chronic inguinal pain, nerve entrapment, hernia recurrence, mesh complications, hematoma formation, urinary retention, injury to the testicle, numbness in the groin, blood clots, injury to the surrounding structures, and anesthesia risk. We also discussed the typical post operative recovery course, including no heavy lifting for 4-6 weeks. I explained that the likelihood of improvement of their symptoms is good  Because of his BPH symptoms I will place him on perioperative Flomax to help decrease his risk of postoperative urinary retention Current Plans Pt Education - Pamphlet Given - Laparoscopic Hernia Repair: discussed with patient and provided information. Started Tamsulosin HCl 0.4MG , 1 (one) Capsule at  bedtime, #30, 03/04/2017, Ref. Gwyneth Sprout. Andrey Campanile, MD, FACS General, Bariatric, & Minimally Invasive Surgery [facility], [place]

## 2017-03-09 NOTE — Discharge Instructions (Addendum)
If you experience any symptoms of dizziness or decreased heart rate in the future contact heart clinic at [phone].  [facility]  UMBILICAL OR INGUINAL HERNIA REPAIR: POST OP INSTRUCTIONS  Always review your discharge instruction sheet given to you by the facility where your surgery was performed. IF YOU HAVE DISABILITY OR FAMILY LEAVE FORMS, YOU MUST BRING THEM TO THE OFFICE FOR PROCESSING.   DO NOT GIVE THEM TO YOUR DOCTOR.  1. A  prescription for pain medication may be given to you upon discharge.  Take your pain medication as prescribed, if needed.  If narcotic pain medicine is not needed, then you may take acetaminophen (Tylenol) &/or ibuprofen (Advil) as needed. 2. Take your usually prescribed medications unless otherwise directed. 3. If you need a refill on your pain medication, please contact your pharmacy.  They will contact our office to request authorization. Prescriptions will not be filled after 5 pm or on week-ends. 4. You should follow a light diet the first 24 hours after arrival home, such as soup and crackers, etc.  Be sure to include lots of fluids daily.  Resume your normal diet the day after surgery. 5. Most patients will experience some swelling and bruising around the umbilicus or in the groin and scrotum.  Ice packs and reclining will help.  Swelling and bruising can take several days to resolve.  6. It is common to experience some constipation if taking pain medication after surgery.  Increasing fluid intake and taking a stool softener (such as Colace) will usually help or prevent this problem from occurring.  A mild laxative (Milk of Magnesia or Miralax) should be taken according to package directions if there are no bowel movements after 48 hours. 7. Unless discharge instructions indicate otherwise, you may remove your bandages 48 hours after surgery, and you may shower at that time.  You may have steri-strips (small skin tapes) in place directly  over the incision.  These strips should be left on the skin for 7-10 days.  8. ACTIVITIES:  You may resume regular (light) daily activities beginning the next day--such as daily self-care, walking, climbing stairs--gradually increasing activities as tolerated.  You may have sexual intercourse when it is comfortable.  Refrain from any heavy lifting or straining until approved by your doctor. a. You may drive when you are no longer taking prescription pain medication, you can comfortably wear a seatbelt, and you can safely maneuver your car and apply brakes. b. RETURN TO WORK:  9. You should see your doctor in the office for a follow-up appointment approximately 2-3 weeks after your surgery.  Make sure that you call for this appointment within a day or two after you arrive home to insure a convenient appointment time. 10. OTHER INSTRUCTIONS: DO NOT LIFT, PUSH, OR PULL ANYTHING GREATER THAN 15 LBS FOR 4 WEEKS    WHEN TO CALL YOUR DOCTOR: 1. Fever over 101.0 2. Inability to urinate 3. Nausea and/or vomiting 4. Extreme swelling or bruising 5. Continued bleeding from incision. 6. Increased pain, redness, or drainage from the incision  The clinic staff is available to answer your questions during regular business hours.  Please dont hesitate to call and ask to speak to one of the nurses for clinical concerns.  If you have a medical emergency, go to the nearest emergency room or call 911.  A surgeon from [facility] is always on call at the hospital   [address], [address], [place], [place]  [address] ?  [address] [address], [place], [place]   [address] [phone] ? [phone] ? FAX [phone] Web site: [email or web address]   General Anesthesia, Adult, Care After These instructions provide you with information about caring for yourself after your procedure. Your health care provider may also give you more specific instructions. Your treatment has been planned  according to current medical practices, but problems sometimes occur. Call your health care provider if you have any problems or questions after your procedure. What can I expect after the procedure? After the procedure, it is common to have:  Vomiting.  A sore throat.  Mental slowness.  It is common to feel:  Nauseous.  Cold or shivery.  Sleepy.  Tired.  Sore or achy, even in parts of your body where you did not have surgery.  Follow these instructions at home: For at least 24 hours after the procedure:  Do not: ? Participate in activities where you could fall or become injured. ? Drive. ? Use heavy machinery. ? Drink alcohol. ? Take sleeping pills or medicines that cause drowsiness. ? Make important decisions or sign legal documents. ? Take care of children on your own.  Rest. Eating and drinking  If you vomit, drink water, juice, or soup when you can drink without vomiting.  Drink enough fluid to keep your urine clear or pale yellow.  Make sure you have little or no nausea before eating solid foods.  Follow the diet recommended by your health care provider. General instructions  Have a responsible adult stay with you until you are awake and alert.  Return to your normal activities as told by your health care provider. Ask your health care provider what activities are safe for you.  Take over-the-counter and prescription medicines only as told by your health care provider.  If you smoke, do not smoke without supervision.  Keep all follow-up visits as told by your health care provider. This is important. Contact a health care provider if:  You continue to have nausea or vomiting at home, and medicines are not helpful.  You cannot drink fluids or start eating again.  You cannot urinate after 8-12 hours.  You develop a skin rash.  You have fever.  You have increasing redness at the site of your procedure. Get help right away if:  You have  difficulty breathing.  You have chest pain.  You have unexpected bleeding.  You feel that you are having a life-threatening or urgent problem. This information is not intended to replace advice given to you by your health care provider. Make sure you discuss any questions you have with your health care provider. Document Released: 08/24/2000 Document Revised: 10/21/2015 Document Reviewed: 05/02/2015 Elsevier Interactive Patient Education  [employer].

## 2017-03-30 DIAGNOSIS — Z23 Encounter for immunization: Secondary | ICD-10-CM | POA: Diagnosis not present

## 2017-04-20 DIAGNOSIS — H25813 Combined forms of age-related cataract, bilateral: Secondary | ICD-10-CM | POA: Diagnosis not present

## 2017-10-04 DIAGNOSIS — E7849 Other hyperlipidemia: Secondary | ICD-10-CM | POA: Diagnosis not present

## 2017-10-04 DIAGNOSIS — Z125 Encounter for screening for malignant neoplasm of prostate: Secondary | ICD-10-CM | POA: Diagnosis not present

## 2017-10-04 DIAGNOSIS — I10 Essential (primary) hypertension: Secondary | ICD-10-CM | POA: Diagnosis not present

## 2017-10-07 DIAGNOSIS — Z0000 Encounter for general adult medical examination without abnormal findings: Secondary | ICD-10-CM | POA: Diagnosis not present

## 2017-10-07 DIAGNOSIS — I10 Essential (primary) hypertension: Secondary | ICD-10-CM | POA: Diagnosis not present

## 2017-10-07 DIAGNOSIS — M1990 Unspecified osteoarthritis, unspecified site: Secondary | ICD-10-CM | POA: Diagnosis not present

## 2017-10-07 DIAGNOSIS — Z87448 Personal history of other diseases of urinary system: Secondary | ICD-10-CM | POA: Diagnosis not present

## 2017-10-07 DIAGNOSIS — R002 Palpitations: Secondary | ICD-10-CM | POA: Diagnosis not present

## 2017-10-07 DIAGNOSIS — R209 Unspecified disturbances of skin sensation: Secondary | ICD-10-CM | POA: Diagnosis not present

## 2017-10-07 DIAGNOSIS — K4090 Unilateral inguinal hernia, without obstruction or gangrene, not specified as recurrent: Secondary | ICD-10-CM | POA: Diagnosis not present

## 2017-10-07 DIAGNOSIS — E7849 Other hyperlipidemia: Secondary | ICD-10-CM | POA: Diagnosis not present

## 2017-10-07 DIAGNOSIS — Z1389 Encounter for screening for other disorder: Secondary | ICD-10-CM | POA: Diagnosis not present

## 2017-10-07 DIAGNOSIS — F419 Anxiety disorder, unspecified: Secondary | ICD-10-CM | POA: Diagnosis not present

## 2017-10-13 DIAGNOSIS — Z1212 Encounter for screening for malignant neoplasm of rectum: Secondary | ICD-10-CM | POA: Diagnosis not present

## 2018-03-08 DIAGNOSIS — Z23 Encounter for immunization: Secondary | ICD-10-CM | POA: Diagnosis not present

## 2018-03-14 DIAGNOSIS — M4316 Spondylolisthesis, lumbar region: Secondary | ICD-10-CM | POA: Diagnosis not present

## 2018-03-14 DIAGNOSIS — M48061 Spinal stenosis, lumbar region without neurogenic claudication: Secondary | ICD-10-CM | POA: Diagnosis not present

## 2018-03-14 DIAGNOSIS — M5416 Radiculopathy, lumbar region: Secondary | ICD-10-CM | POA: Diagnosis not present

## 2018-03-14 DIAGNOSIS — M545 Low back pain: Secondary | ICD-10-CM | POA: Diagnosis not present

## 2018-03-16 DIAGNOSIS — M5416 Radiculopathy, lumbar region: Secondary | ICD-10-CM | POA: Diagnosis not present

## 2018-03-16 DIAGNOSIS — M5125 Other intervertebral disc displacement, thoracolumbar region: Secondary | ICD-10-CM | POA: Diagnosis not present

## 2018-03-28 DIAGNOSIS — M5416 Radiculopathy, lumbar region: Secondary | ICD-10-CM | POA: Diagnosis not present

## 2018-03-28 DIAGNOSIS — I10 Essential (primary) hypertension: Secondary | ICD-10-CM | POA: Diagnosis not present

## 2018-03-28 DIAGNOSIS — M4316 Spondylolisthesis, lumbar region: Secondary | ICD-10-CM | POA: Diagnosis not present

## 2018-03-28 DIAGNOSIS — M48061 Spinal stenosis, lumbar region without neurogenic claudication: Secondary | ICD-10-CM | POA: Diagnosis not present

## 2018-03-29 DIAGNOSIS — M5416 Radiculopathy, lumbar region: Secondary | ICD-10-CM | POA: Diagnosis not present

## 2018-05-05 DIAGNOSIS — H25813 Combined forms of age-related cataract, bilateral: Secondary | ICD-10-CM | POA: Diagnosis not present

## 2018-05-19 DIAGNOSIS — M48061 Spinal stenosis, lumbar region without neurogenic claudication: Secondary | ICD-10-CM | POA: Diagnosis not present

## 2018-08-25 DIAGNOSIS — M48061 Spinal stenosis, lumbar region without neurogenic claudication: Secondary | ICD-10-CM | POA: Diagnosis not present

## 2018-10-10 DIAGNOSIS — M48061 Spinal stenosis, lumbar region without neurogenic claudication: Secondary | ICD-10-CM | POA: Diagnosis not present

## 2018-11-28 DIAGNOSIS — I10 Essential (primary) hypertension: Secondary | ICD-10-CM | POA: Diagnosis not present

## 2018-11-28 DIAGNOSIS — F419 Anxiety disorder, unspecified: Secondary | ICD-10-CM | POA: Diagnosis not present

## 2020-02-21 ENCOUNTER — Other Ambulatory Visit: Payer: Self-pay | Admitting: Neurosurgery

## 2020-03-05 NOTE — H&P (Signed)
Patient ID:   [id number] Patient: Javier Ewing  Date of Birth: June 04, 1945 Visit Type: Office Visit   Date: 02/21/2020 09:30 AM Provider: Danae Orleans. Venetia Maxon MD   This 74 year old male presents for MRI Review.  HISTORY OF PRESENT ILLNESS: 1.  MRI Review  The patient says that he is getting worse and that the last shot did not give him any significant relief.  This was performed in June of this year.  He now describes bad, constant pain in his low back and into both legs.  He says he is better with sitting.  He had prior L4 through S1 fusion by [facility] on 04/23/2015.  He has developed progressively worsening spinal stenosis at the L3-4 level.  His situation is complicated by his wife's macular degeneration which is quite severe and he is doing all the driving and much of the care for her.  Lumbar imaging demonstrates progressively worsening spinal stenosis at the L3-4 level.  I have recommended proceeding with right XL IF L3-4 level with lateral plate.  While he does have some degenerative changes at the L2-3 level these are not severe and I do not think they need to be addressed surgically.      Medical/Surgical/Interim History Reviewed, no change.  Last detailed document date:04/01/2015.     PAST MEDICAL HISTORY, SURGICAL HISTORY, FAMILY HISTORY, SOCIAL HISTORY AND REVIEW OF SYSTEMS I have reviewed the patient's past medical, surgical, family and social history as well as the comprehensive review of systems as included on the [facility] history form dated 03/31/2018, which I have signed.  Family History: Reviewed, no changes.  Last detailed document date:04/01/2015.   Social History: Reviewed, no changes. Last detailed document date: 04/01/2015.    MEDICATIONS: (added, continued or stopped this visit) Started Medication Directions Instruction Stopped  aspirin 81 mg tablet,delayed release take 1 tablet by oral route  every  day   01/17/2018 citalopram 10 mg tablet TK 1 T PO ONCE D    ibuprofen     losartan 100 mg-hydrochlorothiazide 12.5 mg tablet take 1 tablet by oral route  every day    multivitamin Take daily    Vitamin D3 Take daily      ALLERGIES: Ingredient Reaction Medication Name Comment NO KNOWN ALLERGIES    No known allergies. Reviewed, no changes.    PHYSICAL EXAM:  Vitals Date Temp F BP Pulse Ht In Wt Lb BMI BSA Pain Score 02/21/2020  157/91 62 70 169.2 24.28  7/10     IMPRESSION:  Progressively worsening spinal stenosis at the L3-4 level which is refractory to injections and conservative management  PLAN: Proceed with right-sided XL IF L3-4 level with lateral plate.  Risks and benefits were discussed in detail with the patient he wishes to proceed with surgery.  Orders: Diagnostic Procedures: Assessment Procedure M54.16 Lumbar Spine- AP/Lat Instruction(s)/Education: Assessment Instruction I10 Lifestyle education  Completed Orders (this encounter) Order Details Reason Side Interpretation Result Initial Treatment Date Region Lifestyle education Patient will follow up with Primary Care Physician.        Assessment/Plan  # Detail Type Description  1. Assessment Low back pain, unspecified back pain laterality, with sciatica presence unspecified (M54.5).     2. Assessment Radiculopathy, lumbar region (M54.16).     3. Assessment Spinal stenosis, lumbar region without neurogenic claudication (M48.061).     4. 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     5. Assessment Essential (primary) hypertension (I10).  Pain Management Plan Pain Scale: 7/10. Method: Numeric Pain Intensity Scale. Location: back. Onset: 02/01/2018. Duration: varies. Quality: discomforting. Pain management follow-up plan of care: Patient will continue medication management..               Provider:  Danae Orleans. Venetia Maxon MD  02/24/2020 02:42 PM    Dictation edited by: Danae Orleans. Venetia Maxon    CC Providers: PCP  None   Geoffry Paradise  [facility] [address] [place], [place] [address]-               Electronically signed by Danae Orleans Venetia Maxon MD on 02/24/2020 02:42 PM

## 2020-03-12 NOTE — Progress Notes (Signed)
Your procedure is scheduled on Thursday, March 14, 2020.  Report to [facility] Main Entrance "A" at 10:20 A.M., and check in at the Admitting office.  Call this number if you have problems the morning of surgery:  [phone]  Call [phone] if you have any questions prior to your surgery date Monday-Friday 8am-4pm    Remember:  Do not eat or drink after midnight the night before your surgery    Do not take any medications the morning of surgery.   As of today, STOP taking any Aspirin (unless otherwise instructed by your surgeon) Aleve, Naproxen, Ibuprofen, Motrin, Advil, Goody's, BC's, all herbal medications, fish oil, and all vitamins.                      Do not wear jewelry,.            Do not wear lotions, powders, colognes, or deodorant.            Do not shave 48 hours prior to surgery.  Men may shave face and neck.            Do not bring valuables to the hospital.            [facility] is not responsible for any belongings or valuables.  Do NOT Smoke (Tobacco/Vaping) or drink Alcohol 24 hours prior to your procedure If you use a CPAP at night, you may bring all equipment for your overnight stay.   Contacts, glasses, dentures or bridgework may not be worn into surgery.      For patients admitted to the hospital, discharge time will be determined by your treatment team.   Patients discharged the day of surgery will not be allowed to drive home, and someone needs to stay with them for 24 hours.    Special instructions:   [facility]- Preparing For Surgery  Before surgery, you can play an important role. Because skin is not sterile, your skin needs to be as free of germs as possible. You can reduce the number of germs on your skin by washing with CHG (chlorahexidine gluconate) Soap before surgery.  CHG is an antiseptic cleaner which kills germs and bonds with the skin to continue killing germs even after washing.    Oral Hygiene is also important to reduce your  risk of infection.  Remember - BRUSH YOUR TEETH THE MORNING OF SURGERY WITH YOUR REGULAR TOOTHPASTE  Please do not use if you have an allergy to CHG or antibacterial soaps. If your skin becomes reddened/irritated stop using the CHG.  Do not shave (including legs and underarms) for at least 48 hours prior to first CHG shower. It is OK to shave your face.  Please follow these instructions carefully.   1. Shower the NIGHT BEFORE SURGERY and the MORNING OF SURGERY with CHG Soap.   2. If you chose to wash your hair, wash your hair first as usual with your normal shampoo.  3. After you shampoo, rinse your hair and body thoroughly to remove the shampoo.  4. Use CHG as you would any other liquid soap. You can apply CHG directly to the skin and wash gently with a scrungie or a clean washcloth.   5. Apply the CHG Soap to your body ONLY FROM THE NECK DOWN.  Do not use on open wounds or open sores. Avoid contact with your eyes, ears, mouth and genitals (private parts). Wash Face and genitals (private parts)  with your normal soap.  6. Wash thoroughly, paying special attention to the area where your surgery will be performed.  7. Thoroughly rinse your body with warm water from the neck down.  8. DO NOT shower/wash with your normal soap after using and rinsing off the CHG Soap.  9. Pat yourself dry with a CLEAN TOWEL.  10. Wear CLEAN PAJAMAS to bed the night before surgery  11. Place CLEAN SHEETS on your bed the night of your first shower and DO NOT SLEEP WITH PETS.   Day of Surgery: Wear Clean/Comfortable clothing the morning of surgery Do not apply any deodorants/lotions.   Remember to brush your teeth WITH YOUR REGULAR TOOTHPASTE.   Please read over the following fact sheets that you were given.

## 2020-03-13 ENCOUNTER — Other Ambulatory Visit: Payer: Self-pay

## 2020-03-13 ENCOUNTER — Inpatient Hospital Stay (HOSPITAL_COMMUNITY): Payer: Medicare Other

## 2020-03-13 ENCOUNTER — Encounter (HOSPITAL_COMMUNITY)
Admission: RE | Admit: 2020-03-13 | Discharge: 2020-03-13 | Disposition: A | Discharge status: Home / Self Care | Payer: Medicare Other | Source: Ambulatory Visit | Attending: Neurosurgery | Admitting: Neurosurgery

## 2020-03-13 ENCOUNTER — Encounter (HOSPITAL_COMMUNITY): Payer: Self-pay

## 2020-03-13 ENCOUNTER — Other Ambulatory Visit (HOSPITAL_COMMUNITY)
Admission: RE | Admit: 2020-03-13 | Discharge: 2020-03-13 | Disposition: A | Discharge status: Home / Self Care | Payer: Medicare Other | Source: Ambulatory Visit | Attending: Neurosurgery | Admitting: Neurosurgery

## 2020-03-13 DIAGNOSIS — Z01818 Encounter for other preprocedural examination: Secondary | ICD-10-CM | POA: Insufficient documentation

## 2020-03-13 DIAGNOSIS — Z20822 Contact with and (suspected) exposure to covid-19: Secondary | ICD-10-CM | POA: Insufficient documentation

## 2020-03-13 DIAGNOSIS — Z01812 Encounter for preprocedural laboratory examination: Secondary | ICD-10-CM | POA: Insufficient documentation

## 2020-03-13 LAB — TYPE AND SCREEN
ABO/RH(D): O NEG
Antibody Screen: NEGATIVE

## 2020-03-13 LAB — CBC
HCT: 44.8 % (ref 39.0–52.0)
Hemoglobin: 15 g/dL (ref 13.0–17.0)
MCH: 32.9 pg (ref 26.0–34.0)
MCHC: 33.5 g/dL (ref 30.0–36.0)
MCV: 98.2 fL (ref 80.0–100.0)
Platelets: 257 10*3/uL (ref 150–400)
RBC: 4.56 MIL/uL (ref 4.22–5.81)
RDW: 12.9 % (ref 11.5–15.5)
WBC: 8.2 10*3/uL (ref 4.0–10.5)
nRBC: 0 % (ref 0.0–0.2)

## 2020-03-13 LAB — SURGICAL PCR SCREEN
MRSA, PCR: NEGATIVE
Staphylococcus aureus: NEGATIVE

## 2020-03-13 LAB — BASIC METABOLIC PANEL
Anion gap: 9 (ref 5–15)
BUN: 16 mg/dL (ref 8–23)
CO2: 28 mmol/L (ref 22–32)
Calcium: 10.4 mg/dL — ABNORMAL HIGH (ref 8.9–10.3)
Chloride: 102 mmol/L (ref 98–111)
Creatinine, Ser: 0.89 mg/dL (ref 0.61–1.24)
GFR, Estimated: >60 mL/min (ref >60)
Glucose, Bld: 99 mg/dL (ref 70–99)
Potassium: 4.1 mmol/L (ref 3.5–5.1)
Sodium: 139 mmol/L (ref 135–145)

## 2020-03-13 LAB — SARS CORONAVIRUS 2 (TAT 6-24 HRS): SARS Coronavirus 2: NEGATIVE

## 2020-03-13 NOTE — Progress Notes (Signed)
PCP - Dr. Kem Boroughs Cardiologist - Denies  PPM/ICD - Denies  Chest x-ray - N/A EKG - 03/13/20 Stress Test - Denies ECHO - Denies Cardiac Cath - Denies  Sleep Study - Denies  Patient denies having diabetes.  Blood Thinner Instructions: N/A Aspirin Instructions: N/A  ERAS Protcol - No  COVID TEST- 03/13/20   Anesthesia review: Yes, abnormal EKG  Patient denies shortness of breath, fever, cough and chest pain at PAT appointment   All instructions explained to the patient, with a verbal understanding of the material. Patient agrees to go over the instructions while at home for a better understanding. Patient also instructed to self quarantine after being tested for COVID-19. The opportunity to ask questions was provided.

## 2020-03-14 ENCOUNTER — Other Ambulatory Visit: Payer: Self-pay

## 2020-03-14 ENCOUNTER — Encounter (HOSPITAL_COMMUNITY): Payer: Self-pay | Admitting: Neurosurgery

## 2020-03-14 ENCOUNTER — Ambulatory Visit (HOSPITAL_COMMUNITY)
Admission: RE | Admit: 2020-03-14 | Discharge: 2020-03-14 | Disposition: A | Discharge status: Home / Self Care | Payer: Medicare Other | Attending: Neurosurgery | Admitting: Neurosurgery

## 2020-03-14 ENCOUNTER — Encounter (HOSPITAL_COMMUNITY)
Admission: RE | Disposition: A | Discharge status: Home / Self Care | Payer: Self-pay | Source: Home / Self Care | Attending: Neurosurgery

## 2020-03-14 DIAGNOSIS — I10 Essential (primary) hypertension: Secondary | ICD-10-CM | POA: Insufficient documentation

## 2020-03-14 DIAGNOSIS — M5416 Radiculopathy, lumbar region: Secondary | ICD-10-CM | POA: Diagnosis not present

## 2020-03-14 DIAGNOSIS — M5441 Lumbago with sciatica, right side: Secondary | ICD-10-CM | POA: Insufficient documentation

## 2020-03-14 DIAGNOSIS — M48061 Spinal stenosis, lumbar region without neurogenic claudication: Secondary | ICD-10-CM | POA: Insufficient documentation

## 2020-03-14 DIAGNOSIS — M4316 Spondylolisthesis, lumbar region: Secondary | ICD-10-CM | POA: Diagnosis not present

## 2020-03-14 SURGERY — ANTERIOR LATERAL LUMBAR FUSION 1 LEVEL
Anesthesia: General

## 2020-03-14 MED ORDER — FENTANYL CITRATE (PF) 250 MCG/5ML IJ SOLN
INTRAMUSCULAR | Status: AC
  Filled 2020-03-14: qty 5

## 2020-03-14 MED ORDER — CEFAZOLIN SODIUM-DEXTROSE 2-4 GM/100ML-% IV SOLN
2.0000 g | INTRAVENOUS | Status: DC
  Filled 2020-03-14: qty 100

## 2020-03-14 MED ORDER — CHLORHEXIDINE GLUCONATE CLOTH 2 % EX PADS: 6.0000 | MEDICATED_PAD | Freq: Once | CUTANEOUS | Status: DC

## 2020-03-14 MED ORDER — LIDOCAINE-EPINEPHRINE 1 %-1:100000 IJ SOLN
INTRAMUSCULAR | Status: AC
  Filled 2020-03-14: qty 1

## 2020-03-14 MED ORDER — DEXAMETHASONE SODIUM PHOSPHATE 10 MG/ML IJ SOLN
INTRAMUSCULAR | Status: AC
  Filled 2020-03-14: qty 1

## 2020-03-14 MED ORDER — ONDANSETRON HCL 4 MG/2ML IJ SOLN
INTRAMUSCULAR | Status: AC
  Filled 2020-03-14: qty 2

## 2020-03-14 MED ORDER — PROPOFOL 1000 MG/100ML IV EMUL
INTRAVENOUS | Status: AC
  Filled 2020-03-14: qty 100

## 2020-03-14 MED ORDER — LACTATED RINGERS IV SOLN: INTRAVENOUS | Status: DC

## 2020-03-14 MED ORDER — THROMBIN 5000 UNITS EX SOLR
CUTANEOUS | Status: AC
  Filled 2020-03-14: qty 5000

## 2020-03-14 MED ORDER — CHLORHEXIDINE GLUCONATE 0.12 % MT SOLN
15.0000 mL | Freq: Once | OROMUCOSAL | Status: AC
  Administered 2020-03-14: 15 mL via OROMUCOSAL
  Filled 2020-03-14: qty 15

## 2020-03-14 MED ORDER — BUPIVACAINE HCL (PF) 0.5 % IJ SOLN
INTRAMUSCULAR | Status: AC
  Filled 2020-03-14: qty 30

## 2020-03-14 MED ORDER — PROPOFOL 10 MG/ML IV BOLUS
INTRAVENOUS | Status: AC
  Filled 2020-03-14: qty 20

## 2020-03-14 MED ORDER — PROPOFOL 1000 MG/100ML IV EMUL
INTRAVENOUS | Status: AC
  Filled 2020-03-14: qty 200

## 2020-03-14 MED ORDER — ORAL CARE MOUTH RINSE: 15.0000 mL | Freq: Once | OROMUCOSAL | Status: AC

## 2020-03-14 NOTE — Progress Notes (Signed)
Elevated BP in Pre-Op: BP elevated. Patient takes losartan (COZAAR) 100 MG daily-- last took it last night. Dr. Chilton Si aware. No new orders at this time.   03/14/20 1016 03/14/20 1058 03/14/20 1105  Vitals  Temp 97.7 F (36.5 C)  --   --   Temp Source Temporal  --   --   Pulse Rate 81  --   --   Pulse Rate Source Monitor;Left  --   --   Resp 18  --   --   Respiratory Pattern Regular  --   --   BP (!) 151/104  --  (!) 180/112  SpO2 95 %  --   --   Oxygen Therapy  O2 Device Room Air  --   --   Height and Weight  Height 5\' 11"  (1.803 m)  --   --   Height Method Stated  --   --   Weight 76.7 kg  --   --   Weight Method Stated  --   --   BMI (Calculated) 23.58  --   --   BSA (Calculated - sq m) 1.96 sq meters  --   --   Pain Assessment  Pain Scale  --  0-10  --   Pain Score  --  5  --   Pain Type  --  Chronic pain  --   Pain Location  --  Back  --   Pain Orientation  --  Mid  --   Pain Descriptors / Indicators  --  Aching  --   Patients Stated Pain Goal  --  2  --   Pain Intervention(s)  --  RN made aware  --   LOC  Level of Consciousness  --  Alert  --   Orientation Level  --  Oriented X4  --     03/14/20 1120  Vitals  Temp  --   Temp Source  --   Pulse Rate  --   Pulse Rate Source  --   Resp  --   Respiratory Pattern  --   BP (!) 166/108  SpO2  --   Oxygen Therapy  O2 Device  --   Height and Weight  Height  --   Height Method  --   Weight  --   Weight Method  --   BMI (Calculated)  --   BSA (Calculated - sq m)  --   Pain Assessment  Pain Scale  --   Pain Score  --   Pain Type  --   Pain Location  --   Pain Orientation  --   Pain Descriptors / Indicators  --   Patients Stated Pain Goal  --   Pain Intervention(s)  --   LOC  Level of Consciousness  --   Orientation Level  --

## 2020-03-14 NOTE — Anesthesia Preprocedure Evaluation (Signed)
Anesthesia Evaluation  Patient identified by MRN, date of birth, ID band Patient awake    Reviewed: Allergy & Precautions, NPO status , Patient's Chart, lab work & pertinent test results  Airway Mallampati: II  TM Distance: >3 FB     Dental   Pulmonary neg pulmonary ROS,    breath sounds clear to auscultation       Cardiovascular hypertension,  Rhythm:Regular Rate:Normal     Neuro/Psych    GI/Hepatic negative GI ROS, Neg liver ROS,   Endo/Other  negative endocrine ROS  Renal/GU negative Renal ROS     Musculoskeletal  (+) Arthritis ,   Abdominal   Peds  Hematology   Anesthesia Other Findings   Reproductive/Obstetrics                             Anesthesia Physical Anesthesia Plan  ASA: II  Anesthesia Plan: General   Post-op Pain Management:    Induction: Intravenous  PONV Risk Score and Plan: 2 and Ondansetron, Dexamethasone and Midazolam  Airway Management Planned: Oral ETT  Additional Equipment:   Intra-op Plan:   Post-operative Plan: Extubation in OR  Informed Consent: I have reviewed the patients History and Physical, chart, labs and discussed the procedure including the risks, benefits and alternatives for the proposed anesthesia with the patient or authorized representative who has indicated his/her understanding and acceptance.     Dental advisory given  Plan Discussed with: CRNA and Anesthesiologist  Anesthesia Plan Comments:         Anesthesia Quick Evaluation

## 2020-03-14 NOTE — Progress Notes (Signed)
Surgery Cancelled due to elevated BP: Patient instructed to follow up with PCP for BP control.   03/14/20 1139 03/14/20 1151 03/14/20 1200  Vitals  BP (!) 186/126 (!) 172/113 (!) 171/103    03/14/20 1203 03/14/20 1206 03/14/20 1208  Vitals  BP (!) 171/119 (!) 175/113 (!) 172/101

## 2020-03-14 NOTE — Progress Notes (Signed)
Pt discharged-- IV removed. Pt denies CP, SOB, NAD noted. Wheeled out to [employer] parking by this nurse; pt handed over to care of family.
